# Patient Record
Sex: Female | Born: 1960 | Hispanic: Yes | Marital: Married | Smoking: Never smoker
Health system: Southern US, Community
[De-identification: ages and names within clinical notes are randomized; demographics above are authoritative.]

## PROBLEM LIST (undated history)

## (undated) DIAGNOSIS — IMO0002 Reserved for concepts with insufficient information to code with codable children: Secondary | ICD-10-CM

## (undated) DIAGNOSIS — K219 Gastro-esophageal reflux disease without esophagitis: Secondary | ICD-10-CM

## (undated) DIAGNOSIS — J45909 Unspecified asthma, uncomplicated: Secondary | ICD-10-CM

## (undated) DIAGNOSIS — M329 Systemic lupus erythematosus, unspecified: Secondary | ICD-10-CM

## (undated) HISTORY — PX: BACK SURGERY: SHX140

---

## 2004-05-27 ENCOUNTER — Ambulatory Visit: Payer: Self-pay | Admitting: Family Medicine

## 2006-01-22 ENCOUNTER — Emergency Department: Payer: Self-pay | Admitting: Emergency Medicine

## 2006-07-21 ENCOUNTER — Ambulatory Visit: Payer: Self-pay | Admitting: Family Medicine

## 2007-02-07 ENCOUNTER — Ambulatory Visit: Payer: Self-pay

## 2007-04-11 ENCOUNTER — Ambulatory Visit: Payer: Self-pay | Admitting: Family Medicine

## 2007-08-28 DIAGNOSIS — K219 Gastro-esophageal reflux disease without esophagitis: Secondary | ICD-10-CM | POA: Insufficient documentation

## 2007-08-28 DIAGNOSIS — M329 Systemic lupus erythematosus, unspecified: Secondary | ICD-10-CM | POA: Insufficient documentation

## 2008-04-03 ENCOUNTER — Ambulatory Visit: Payer: Self-pay | Admitting: Family Medicine

## 2010-04-09 ENCOUNTER — Ambulatory Visit: Payer: Self-pay | Admitting: Family Medicine

## 2011-11-24 ENCOUNTER — Ambulatory Visit: Payer: Self-pay | Admitting: Family Medicine

## 2012-03-08 ENCOUNTER — Ambulatory Visit: Payer: Self-pay | Admitting: Family Medicine

## 2013-02-16 ENCOUNTER — Ambulatory Visit: Payer: Self-pay | Admitting: Family Medicine

## 2013-11-08 ENCOUNTER — Ambulatory Visit: Payer: Self-pay | Admitting: Family Medicine

## 2013-11-14 ENCOUNTER — Ambulatory Visit: Payer: Self-pay | Admitting: Family Medicine

## 2014-08-21 ENCOUNTER — Ambulatory Visit: Payer: Self-pay | Admitting: Family Medicine

## 2014-11-15 ENCOUNTER — Ambulatory Visit
Admit: 2014-11-15 | Disposition: A | Discharge status: Home / Self Care | Payer: Self-pay | Attending: Gastroenterology | Admitting: Gastroenterology

## 2014-12-30 ENCOUNTER — Other Ambulatory Visit: Payer: Self-pay | Admitting: Family Medicine

## 2014-12-30 DIAGNOSIS — Z0000 Encounter for general adult medical examination without abnormal findings: Secondary | ICD-10-CM

## 2015-01-03 ENCOUNTER — Ambulatory Visit: Payer: Self-pay

## 2015-01-08 ENCOUNTER — Ambulatory Visit: Payer: Self-pay

## 2015-01-21 ENCOUNTER — Ambulatory Visit
Admission: RE | Admit: 2015-01-21 | Discharge: 2015-01-21 | Disposition: A | Discharge status: Home / Self Care | Payer: Medicare Other | Source: Ambulatory Visit | Attending: Family Medicine | Admitting: Family Medicine

## 2015-01-21 DIAGNOSIS — M8580 Other specified disorders of bone density and structure, unspecified site: Secondary | ICD-10-CM | POA: Diagnosis not present

## 2015-01-21 DIAGNOSIS — Z0000 Encounter for general adult medical examination without abnormal findings: Secondary | ICD-10-CM

## 2015-01-21 DIAGNOSIS — Z13820 Encounter for screening for osteoporosis: Secondary | ICD-10-CM | POA: Diagnosis present

## 2015-01-31 DIAGNOSIS — M8580 Other specified disorders of bone density and structure, unspecified site: Secondary | ICD-10-CM | POA: Insufficient documentation

## 2015-04-23 ENCOUNTER — Other Ambulatory Visit: Payer: Self-pay | Admitting: Orthopedic Surgery

## 2015-04-23 DIAGNOSIS — M5136 Other intervertebral disc degeneration, lumbar region: Secondary | ICD-10-CM

## 2015-04-23 DIAGNOSIS — M5416 Radiculopathy, lumbar region: Secondary | ICD-10-CM

## 2015-04-30 ENCOUNTER — Ambulatory Visit: Payer: Medicare Other

## 2015-05-07 ENCOUNTER — Ambulatory Visit: Payer: Medicare Other

## 2015-05-21 ENCOUNTER — Ambulatory Visit: Payer: Medicare Other

## 2015-05-29 ENCOUNTER — Ambulatory Visit: Payer: Medicare Other

## 2015-06-10 ENCOUNTER — Ambulatory Visit
Admission: RE | Admit: 2015-06-10 | Discharge: 2015-06-10 | Disposition: A | Discharge status: Home / Self Care | Payer: Medicare Other | Source: Ambulatory Visit | Attending: Orthopedic Surgery | Admitting: Orthopedic Surgery

## 2015-06-10 DIAGNOSIS — M5136 Other intervertebral disc degeneration, lumbar region: Secondary | ICD-10-CM

## 2015-06-10 DIAGNOSIS — M419 Scoliosis, unspecified: Secondary | ICD-10-CM | POA: Insufficient documentation

## 2015-06-10 DIAGNOSIS — M5416 Radiculopathy, lumbar region: Secondary | ICD-10-CM | POA: Insufficient documentation

## 2017-02-07 ENCOUNTER — Other Ambulatory Visit: Payer: Self-pay | Admitting: Family Medicine

## 2017-02-07 DIAGNOSIS — M8580 Other specified disorders of bone density and structure, unspecified site: Secondary | ICD-10-CM

## 2017-02-07 DIAGNOSIS — Z0000 Encounter for general adult medical examination without abnormal findings: Secondary | ICD-10-CM

## 2017-02-25 ENCOUNTER — Other Ambulatory Visit: Payer: Medicare Other

## 2017-04-06 ENCOUNTER — Ambulatory Visit
Admission: RE | Admit: 2017-04-06 | Discharge: 2017-04-06 | Disposition: A | Discharge status: Home / Self Care | Payer: Medicare Other | Source: Ambulatory Visit | Attending: Family Medicine | Admitting: Family Medicine

## 2017-04-06 DIAGNOSIS — Z1231 Encounter for screening mammogram for malignant neoplasm of breast: Secondary | ICD-10-CM | POA: Insufficient documentation

## 2017-04-06 DIAGNOSIS — Z0000 Encounter for general adult medical examination without abnormal findings: Secondary | ICD-10-CM

## 2017-04-06 DIAGNOSIS — M85851 Other specified disorders of bone density and structure, right thigh: Secondary | ICD-10-CM | POA: Diagnosis not present

## 2017-04-06 DIAGNOSIS — M8580 Other specified disorders of bone density and structure, unspecified site: Secondary | ICD-10-CM

## 2017-07-18 ENCOUNTER — Other Ambulatory Visit: Payer: Self-pay | Admitting: Family Medicine

## 2017-07-18 ENCOUNTER — Ambulatory Visit
Admission: RE | Admit: 2017-07-18 | Discharge: 2017-07-18 | Disposition: A | Discharge status: Home / Self Care | Payer: Medicare Other | Source: Ambulatory Visit | Attending: Family Medicine | Admitting: Family Medicine

## 2017-07-18 DIAGNOSIS — M545 Low back pain: Principal | ICD-10-CM

## 2017-07-18 DIAGNOSIS — M5136 Other intervertebral disc degeneration, lumbar region: Secondary | ICD-10-CM | POA: Insufficient documentation

## 2017-07-18 DIAGNOSIS — G8929 Other chronic pain: Secondary | ICD-10-CM

## 2017-08-18 ENCOUNTER — Other Ambulatory Visit: Payer: Self-pay | Admitting: Neurology

## 2017-08-18 DIAGNOSIS — R531 Weakness: Secondary | ICD-10-CM

## 2017-08-23 ENCOUNTER — Other Ambulatory Visit: Payer: Self-pay | Admitting: Specialist

## 2017-08-23 DIAGNOSIS — R911 Solitary pulmonary nodule: Secondary | ICD-10-CM

## 2017-08-25 ENCOUNTER — Ambulatory Visit: Payer: Medicare Other

## 2017-09-14 ENCOUNTER — Ambulatory Visit
Admission: RE | Admit: 2017-09-14 | Discharge: 2017-09-14 | Disposition: A | Discharge status: Home / Self Care | Payer: Medicare Other | Source: Ambulatory Visit | Attending: Specialist | Admitting: Specialist

## 2017-09-14 DIAGNOSIS — I700 Atherosclerosis of aorta: Secondary | ICD-10-CM | POA: Insufficient documentation

## 2017-09-14 DIAGNOSIS — R918 Other nonspecific abnormal finding of lung field: Secondary | ICD-10-CM | POA: Insufficient documentation

## 2017-09-14 DIAGNOSIS — R911 Solitary pulmonary nodule: Secondary | ICD-10-CM | POA: Diagnosis present

## 2017-09-23 ENCOUNTER — Other Ambulatory Visit: Payer: Self-pay | Admitting: Specialist

## 2017-09-23 DIAGNOSIS — J984 Other disorders of lung: Secondary | ICD-10-CM

## 2018-02-28 ENCOUNTER — Ambulatory Visit: Payer: Medicare Other | Attending: Specialist

## 2018-04-20 ENCOUNTER — Ambulatory Visit
Admission: RE | Admit: 2018-04-20 | Discharge: 2018-04-20 | Disposition: A | Discharge status: Home / Self Care | Payer: Medicare Other | Source: Ambulatory Visit | Attending: Specialist | Admitting: Specialist

## 2018-04-20 DIAGNOSIS — R911 Solitary pulmonary nodule: Secondary | ICD-10-CM | POA: Diagnosis not present

## 2018-04-20 DIAGNOSIS — I700 Atherosclerosis of aorta: Secondary | ICD-10-CM | POA: Diagnosis not present

## 2018-04-20 DIAGNOSIS — J984 Other disorders of lung: Secondary | ICD-10-CM | POA: Diagnosis not present

## 2018-04-28 ENCOUNTER — Other Ambulatory Visit: Payer: Self-pay | Admitting: Specialist

## 2018-04-28 DIAGNOSIS — R918 Other nonspecific abnormal finding of lung field: Secondary | ICD-10-CM

## 2018-05-03 ENCOUNTER — Ambulatory Visit: Payer: Medicare Other

## 2018-07-03 ENCOUNTER — Other Ambulatory Visit: Payer: Self-pay | Admitting: Orthopedic Surgery

## 2018-07-03 DIAGNOSIS — M5416 Radiculopathy, lumbar region: Secondary | ICD-10-CM

## 2018-07-13 ENCOUNTER — Other Ambulatory Visit: Payer: Medicare Other

## 2018-07-22 ENCOUNTER — Ambulatory Visit
Admission: RE | Admit: 2018-07-22 | Discharge: 2018-07-22 | Disposition: A | Discharge status: Home / Self Care | Payer: Medicare Other | Source: Ambulatory Visit | Attending: Orthopedic Surgery | Admitting: Orthopedic Surgery

## 2018-07-22 DIAGNOSIS — M5416 Radiculopathy, lumbar region: Secondary | ICD-10-CM

## 2019-01-01 ENCOUNTER — Emergency Department: Payer: Medicare Other

## 2019-01-01 ENCOUNTER — Encounter: Payer: Self-pay | Admitting: Medical Oncology

## 2019-01-01 ENCOUNTER — Emergency Department
Admission: EM | Admit: 2019-01-01 | Discharge: 2019-01-01 | Disposition: A | Discharge status: Home / Self Care | Payer: Medicare Other | Attending: Emergency Medicine | Admitting: Emergency Medicine

## 2019-01-01 DIAGNOSIS — U071 COVID-19: Secondary | ICD-10-CM | POA: Diagnosis not present

## 2019-01-01 DIAGNOSIS — J988 Other specified respiratory disorders: Secondary | ICD-10-CM | POA: Diagnosis not present

## 2019-01-01 DIAGNOSIS — R0789 Other chest pain: Secondary | ICD-10-CM

## 2019-01-01 LAB — COMPREHENSIVE METABOLIC PANEL
ALT: 25 U/L (ref 0–44)
AST: 30 U/L (ref 15–41)
Albumin: 4.1 g/dL (ref 3.5–5.0)
Alkaline Phosphatase: 43 U/L (ref 38–126)
Anion gap: 11 (ref 5–15)
BUN: 8 mg/dL (ref 6–20)
CO2: 23 mmol/L (ref 22–32)
Calcium: 8.6 mg/dL — ABNORMAL LOW (ref 8.9–10.3)
Chloride: 102 mmol/L (ref 98–111)
Creatinine, Ser: 0.51 mg/dL (ref 0.44–1.00)
GFR calc Af Amer: >60 mL/min (ref >60)
GFR calc non Af Amer: >60 mL/min (ref >60)
Glucose, Bld: 107 mg/dL — ABNORMAL HIGH (ref 70–99)
Potassium: 3.9 mmol/L (ref 3.5–5.1)
Sodium: 136 mmol/L (ref 135–145)
Total Bilirubin: 0.4 mg/dL (ref 0.3–1.2)
Total Protein: 6.9 g/dL (ref 6.5–8.1)

## 2019-01-01 LAB — CBC WITH DIFFERENTIAL/PLATELET
Abs Immature Granulocytes: 0.01 10*3/uL (ref 0.00–0.07)
Basophils Absolute: 0 10*3/uL (ref 0.0–0.1)
Basophils Relative: 0 %
Eosinophils Absolute: 0 10*3/uL (ref 0.0–0.5)
Eosinophils Relative: 0 %
HCT: 35.4 % — ABNORMAL LOW (ref 36.0–46.0)
Hemoglobin: 11.8 g/dL — ABNORMAL LOW (ref 12.0–15.0)
Immature Granulocytes: 0 %
Lymphocytes Relative: 23 %
Lymphs Abs: 1.1 10*3/uL (ref 0.7–4.0)
MCH: 30 pg (ref 26.0–34.0)
MCHC: 33.3 g/dL (ref 30.0–36.0)
MCV: 90.1 fL (ref 80.0–100.0)
Monocytes Absolute: 0.6 10*3/uL (ref 0.1–1.0)
Monocytes Relative: 12 %
Neutro Abs: 3.2 10*3/uL (ref 1.7–7.7)
Neutrophils Relative %: 65 %
Platelets: 204 10*3/uL (ref 150–400)
RBC: 3.93 MIL/uL (ref 3.87–5.11)
RDW: 12.5 % (ref 11.5–15.5)
WBC: 4.9 10*3/uL (ref 4.0–10.5)
nRBC: 0 % (ref 0.0–0.2)

## 2019-01-01 LAB — TROPONIN I: Troponin I: <0.03 ng/mL (ref <0.03)

## 2019-01-01 MED ORDER — HYDROXYZINE HCL 25 MG PO TABS: 25.0000 mg | ORAL_TABLET | Freq: Three times a day (TID) | ORAL | 0 refills | Status: AC | PRN

## 2019-01-01 MED ORDER — GUAIFENESIN-CODEINE 100-10 MG/5ML PO SOLN: 5.0000 mL | Freq: Four times a day (QID) | ORAL | 0 refills | Status: AC | PRN

## 2019-01-01 NOTE — Discharge Instructions (Addendum)
Person Under Monitoring Name: Jennifer Trujillo  Location: [place] [address]   Infection Prevention Recommendations for Individuals Confirmed to have, or Being Evaluated for, 2019 Novel Coronavirus (COVID-19) Infection Who Receive Care at Home  Individuals who are confirmed to have, or are being evaluated for, COVID-19 should follow the prevention steps below until a healthcare provider or local or state health department says they can return to normal activities.  Stay home except to get medical care You should restrict activities outside your home, except for getting medical care. Do not go to work, school, or public areas, and do not use public transportation or taxis.  Call ahead before visiting your doctor Before your medical appointment, call the healthcare provider and tell them that you have, or are being evaluated for, COVID-19 infection. This will help the healthcare providers office take steps to keep other people from getting infected. Ask your healthcare provider to call the local or state health department.  Monitor your symptoms Seek prompt medical attention if your illness is worsening (e.g., difficulty breathing). Before going to your medical appointment, call the healthcare provider and tell them that you have, or are being evaluated for, COVID-19 infection. Ask your healthcare provider to call the local or state health department.  Wear a facemask You should wear a facemask that covers your nose and mouth when you are in the same room with other people and when you visit a healthcare provider. People who live with or visit you should also wear a facemask while they are in the same room with you.  Separate yourself from other people in your home As much as possible, you should stay in a different room from other people in your home. Also, you should use a separate bathroom, if available.  Avoid sharing household items You should not share  dishes, drinking glasses, cups, eating utensils, towels, bedding, or other items with other people in your home. After using these items, you should wash them thoroughly with soap and water.  Cover your coughs and sneezes Cover your mouth and nose with a tissue when you cough or sneeze, or you can cough or sneeze into your sleeve. Throw used tissues in a lined trash can, and immediately wash your hands with soap and water for at least 20 seconds or use an alcohol-based hand rub.  Wash your [facility] your hands often and thoroughly with soap and water for at least 20 seconds. You can use an alcohol-based hand sanitizer if soap and water are not available and if your hands are not visibly dirty. Avoid touching your eyes, nose, and mouth with unwashed hands.   Prevention Steps for Caregivers and Household Members of Individuals Confirmed to have, or Being Evaluated for, COVID-19 Infection Being Cared for in the Home  If you live with, or provide care at home for, a person confirmed to have, or being evaluated for, COVID-19 infection please follow these guidelines to prevent infection:  Follow healthcare providers instructions Make sure that you understand and can help the patient follow any healthcare provider instructions for all care.  Provide for the patients basic needs You should help the patient with basic needs in the home and provide support for getting groceries, prescriptions, and other personal needs.  Monitor the patients symptoms If they are getting sicker, call his or her medical provider and tell them that the patient has, or is being evaluated for, COVID-19 infection. This will help the healthcare providers office take  steps to keep other people from getting infected. Ask the healthcare provider to call the local or state health department.  Limit the number of people who have contact with the patient If possible, have only one caregiver for the patient. Other  household members should stay in another home or place of residence. If this is not possible, they should stay in another room, or be separated from the patient as much as possible. Use a separate bathroom, if available. Restrict visitors who do not have an essential need to be in the home.  Keep older adults, very young children, and other sick people away from the patient Keep older adults, very young children, and those who have compromised immune systems or chronic health conditions away from the patient. This includes people with chronic heart, lung, or kidney conditions, diabetes, and cancer.  Ensure good ventilation Make sure that shared spaces in the home have good air flow, such as from an air conditioner or an opened window, weather permitting.  Wash your hands often Wash your hands often and thoroughly with soap and water for at least 20 seconds. You can use an alcohol based hand sanitizer if soap and water are not available and if your hands are not visibly dirty. Avoid touching your eyes, nose, and mouth with unwashed hands. Use disposable paper towels to dry your hands. If not available, use dedicated cloth towels and replace them when they become wet.  Wear a facemask and gloves Wear a disposable facemask at all times in the room and gloves when you touch or have contact with the patients blood, body fluids, and/or secretions or excretions, such as sweat, saliva, sputum, nasal mucus, vomit, urine, or feces.  Ensure the mask fits over your nose and mouth tightly, and do not touch it during use. Throw out disposable facemasks and gloves after using them. Do not reuse. Wash your hands immediately after removing your facemask and gloves. If your personal clothing becomes contaminated, carefully remove clothing and launder. Wash your hands after handling contaminated clothing. Place all used disposable facemasks, gloves, and other waste in a lined container before disposing them with  other household waste. Remove gloves and wash your hands immediately after handling these items.  Do not share dishes, glasses, or other household items with the patient Avoid sharing household items. You should not share dishes, drinking glasses, cups, eating utensils, towels, bedding, or other items with a patient who is confirmed to have, or being evaluated for, COVID-19 infection. After the person uses these items, you should wash them thoroughly with soap and water.  Wash laundry thoroughly Immediately remove and wash clothes or bedding that have blood, body fluids, and/or secretions or excretions, such as sweat, saliva, sputum, nasal mucus, vomit, urine, or feces, on them. Wear gloves when handling laundry from the patient. Read and follow directions on labels of laundry or clothing items and detergent. In general, wash and dry with the warmest temperatures recommended on the label.  Clean all areas the individual has used often Clean all touchable surfaces, such as counters, tabletops, doorknobs, bathroom fixtures, toilets, phones, keyboards, tablets, and bedside tables, every day. Also, clean any surfaces that may have blood, body fluids, and/or secretions or excretions on them. Wear gloves when cleaning surfaces the patient has come in contact with. Use a diluted bleach solution (e.g., dilute bleach with 1 part bleach and 10 parts water) or a household disinfectant with a label that says EPA-registered for coronaviruses. To make a bleach solution  at home, add 1 tablespoon of bleach to 1 quart (4 cups) of water. For a larger supply, add  cup of bleach to 1 gallon (16 cups) of water. Read labels of cleaning products and follow recommendations provided on product labels. Labels contain instructions for safe and effective use of the cleaning product including precautions you should take when applying the product, such as wearing gloves or eye protection and making sure you have good ventilation  during use of the product. Remove gloves and wash hands immediately after cleaning.  Monitor yourself for signs and symptoms of illness Caregivers and household members are considered close contacts, should monitor their health, and will be asked to limit movement outside of the home to the extent possible. Follow the monitoring steps for close contacts listed on the symptom monitoring form.   ? If you have additional questions, contact your local health department or call the epidemiologist on call at [phone] (available 24/7). ? This guidance is subject to change. For the most up-to-date guidance from [facility], please refer to their website: [email or web address]

## 2019-01-01 NOTE — ED Provider Notes (Signed)
[facility] Emergency Department Provider Note  Time seen: 4:31 PM  I have reviewed the triage vital signs and the nursing notes.   HISTORY  Chief Complaint Chest Pain and Shortness of Breath   HPI Jennifer Trujillo is a 58 y.o. female with a past medical history of anxiety, hyperlipidemia, discoid lupus, chronic back pain, asthma, presents to the emergency department for chest pressure and shortness of breath.  According to the patient over the past 1 week or so she has been experiencing symptoms of intermittent shortness of breath and cough.  [facility] COVID testing which was positive.  Patient states today she felt somewhat more short of breath and was experiencing chest pressure earlier today.  Patient arrives via EMS from home.  Currently denies any chest pain or pressure.  Does state mild shortness of breath but states largely unchanged since her diagnosis.  Has an occasional cough during exam.  Largely negative review of systems otherwise.  No abdominal pain, no current chest pain, no vomiting or diarrhea.  No dysuria.  No past medical history on file.  There are no active problems to display for this patient.   No past surgical history on file.  Prior to Admission medications   Not on File    Not on File  Family History  Problem Relation Age of Onset  . Breast cancer Neg Hx     Social History Social History   Tobacco Use  . Smoking status: Not on file  Substance Use Topics  . Alcohol use: Not on file  . Drug use: Not on file    Review of Systems Constitutional: Intermittent fever ENT: Negative for recent illness/congestion Cardiovascular: Chest pressure this morning Respiratory: Mild shortness of breath.  Positive for cough. Gastrointestinal: Negative for abdominal pain, vomiting and diarrhea. Genitourinary: Negative for urinary compaints Musculoskeletal: Negative for musculoskeletal complaints Skin: Negative  for skin complaints  Neurological: Negative for headache All other ROS negative  ____________________________________________   PHYSICAL EXAM:  VITAL SIGNS: ED Triage Vitals  Enc Vitals Group     BP --      Pulse Rate 01/01/19 1629 84     Resp 01/01/19 1629 18     Temp --      Temp src --      SpO2 01/01/19 1629 100 %     Weight 01/01/19 1630 106 lb (48.1 kg)     Height 01/01/19 1630 5' (1.524 m)     Head Circumference --      Peak Flow --      Pain Score 01/01/19 1630 5     Pain Loc --      Pain Edu? --      Excl. in GC? --    Constitutional: Alert and oriented. Well appearing and in no distress. Eyes: Normal exam ENT      Head: Normocephalic and atraumatic.      Mouth/Throat: Mucous membranes are moist. Cardiovascular: Normal rate, regular rhythm Respiratory: Normal respiratory effort without tachypnea nor retractions. Breath sounds are clear, without wheeze rales rhonchi.  Occasional cough during exam. Gastrointestinal: Soft and nontender. No distention.   Musculoskeletal: Nontender with normal range of motion in all extremities.  Neurologic:  Normal speech and language. No gross focal neurologic deficits  Skin:  Skin is warm, dry and intact.  Psychiatric: Mood and affect are normal.   ____________________________________________    EKG  EKG viewed and interpreted by myself shows a normal sinus rhythm  at 78 bpm with a narrow QRS, normal axis, normal intervals, no ST changes.  ____________________________________________    RADIOLOGY  Chest x-ray negative  ____________________________________________   INITIAL IMPRESSION / ASSESSMENT AND PLAN / ED COURSE  Pertinent labs & imaging results that were available during my care of the patient were reviewed by me and considered in my medical decision making (see chart for details).   Patient with a known positive COVID test presents to the emergency department for chest pressure this morning as well as  continued shortness of breath and cough.  Overall the patient appears extremely well my examination, clear lung sounds does have an occasional dry cough.  Denies any chest pain or pressure at this time.  Patient is EKG is reassuring.  Differential this time would include ACS, COVID-19, pneumonia.  We will check labs including cardiac enzymes and a chest x-ray.  Patient is EKG is reassuring.  Satting 100% on room air currently with a normal respiratory and heart rate.   Chest x-ray is negative.  EKG is reassuring.  Labs are within normal limits including a negative troponin.  Overall the patient's work-up is extremely reassuring with a normal physical exam and reassuring vitals.  Patient is very reassured by the work-up.  We will discharge with a codeine cough medication patient will continue to use Tylenol or ibuprofen every 6 hours for fever or discomfort.  Patient agreeable to plan of care.   Jennifer Trujillo was evaluated in Emergency Department on 01/01/2019 for the symptoms described in the history of present illness. She was evaluated in the context of the global COVID-19 pandemic, which necessitated consideration that the patient might be at risk for infection with the SARS-CoV-2 virus that causes COVID-19. Institutional protocols and algorithms that pertain to the evaluation of patients at risk for COVID-19 are in a state of rapid change based on information released by regulatory bodies including the CDC and federal and state organizations. These policies and algorithms were followed during the patient's care in the ED.  ____________________________________________   FINAL CLINICAL IMPRESSION(S) / ED DIAGNOSES  Chest pressure COVID-19   Harvest Dark, MD 01/01/19 1846

## 2019-01-01 NOTE — ED Triage Notes (Signed)
Pt to ED via ems from home c/o sob that began a few days ago, pt tested positive for Covid on Saturday at [facility]. States that she began having substernal chest pain yesterday that got better then returned this am.

## 2019-01-08 DIAGNOSIS — U071 COVID-19: Secondary | ICD-10-CM | POA: Insufficient documentation

## 2019-05-03 ENCOUNTER — Other Ambulatory Visit: Payer: Self-pay

## 2019-05-03 ENCOUNTER — Ambulatory Visit
Admission: RE | Admit: 2019-05-03 | Discharge: 2019-05-03 | Disposition: A | Discharge status: Home / Self Care | Payer: Medicare Other | Source: Ambulatory Visit | Attending: Specialist | Admitting: Specialist

## 2019-05-03 DIAGNOSIS — R918 Other nonspecific abnormal finding of lung field: Secondary | ICD-10-CM | POA: Diagnosis present

## 2019-05-08 ENCOUNTER — Encounter: Payer: Self-pay | Admitting: Family Medicine

## 2019-05-09 ENCOUNTER — Other Ambulatory Visit: Payer: Self-pay

## 2019-05-10 ENCOUNTER — Other Ambulatory Visit: Payer: Self-pay

## 2019-05-10 ENCOUNTER — Ambulatory Visit (INDEPENDENT_AMBULATORY_CARE_PROVIDER_SITE_OTHER): Payer: Medicare Other | Admitting: Gastroenterology

## 2019-05-10 ENCOUNTER — Encounter: Payer: Self-pay | Admitting: Gastroenterology

## 2019-05-10 VITALS — BP 127/79 | HR 80 | Temp 98.1°F | Resp 17 | Wt 107.4 lb

## 2019-05-10 DIAGNOSIS — K219 Gastro-esophageal reflux disease without esophagitis: Secondary | ICD-10-CM | POA: Diagnosis not present

## 2019-05-10 NOTE — Progress Notes (Signed)
Cephas Darby, MD [address]  [place]  [place], [place] [address]  Main: [phone]  Fax: [phone]    Gastroenterology Consultation  Referring Provider:     Denton Lank, MD Primary Care Physician:  Denton Lank, MD Primary Gastroenterologist:  Dr. Cephas Darby Reason for Consultation:   Chronic GERD        HPI:   Jennifer Trujillo is a 58 y.o. female referred by Dr. Denton Lank, MD  for consultation & management of chronic GERD.  Patient reports that she has been experiencing " a gas bubble" stuck in her chest as well as mild periumbilical discomfort. She denies regurgitation, difficulty swallowing.  She has been started on omeprazole 20 mg by her PCP, which she takes daily and a second pill in the afternoon as needed.  She reports her symptoms are partially improved.  She drinks decaffeinated coffee.  Her weight is stable.  She has history of systemic lupus, maintained on Plaquenil only.  She denies any other GI symptoms.  She had limited abdominal ultrasound which was unremarkable She does not smoke or drink alcohol NSAIDs: None  Antiplts/Anticoagulants/Anti thrombotics: None  GI Procedures: Colonoscopy at [facility] 11/15/2014 Report not available She denies family history of GI malignancy  Current Outpatient Medications:  .  acetaminophen (TYLENOL) 500 MG tablet, Take by mouth., Disp: , Rfl:  .  albuterol (VENTOLIN HFA) 108 (90 Base) MCG/ACT inhaler, Inhale into the lungs., Disp: , Rfl:  .  aspirin 81 MG chewable tablet, Chew by mouth., Disp: , Rfl:  .  calcium carbonate (OS-CAL) 600 MG TABS tablet, Take by mouth., Disp: , Rfl:  .  cetirizine (ZYRTEC) 10 MG tablet, Take by mouth., Disp: , Rfl:  .  Cholecalciferol (D2000 ULTRA STRENGTH) 50 MCG (2000 UT) CAPS, Take by mouth., Disp: , Rfl:  .  eletriptan (RELPAX) 40 MG tablet, Take by mouth., Disp: , Rfl:  .  Ergocalciferol (VITAMIN D2) 50 MCG (2000 UT) TABS, Take by mouth., Disp: , Rfl:  .  fluticasone (FLONASE)  50 MCG/ACT nasal spray, Place into the nose., Disp: , Rfl:  .  gabapentin (NEURONTIN) 100 MG capsule, Take by mouth., Disp: , Rfl:  .  hydroxychloroquine (PLAQUENIL) 200 MG tablet, Take by mouth., Disp: , Rfl:  .  omega-3 acid ethyl esters (LOVAZA) 1 g capsule, Take by mouth., Disp: , Rfl:  .  riboflavin (VITAMIN B-2) 100 MG TABS tablet, Take by mouth., Disp: , Rfl:  .  traMADol (ULTRAM) 50 MG tablet, Take by mouth., Disp: , Rfl:  .  etodolac (LODINE) 500 MG tablet, Take by mouth., Disp: , Rfl:  .  guaiFENesin-codeine 100-10 MG/5ML syrup, Take 5 mLs by mouth every 6 (six) hours as needed for cough. (Patient not taking: Reported on 05/10/2019), Disp: 120 mL, Rfl: 0 .  hydrOXYzine (ATARAX/VISTARIL) 25 MG tablet, Take 1 tablet (25 mg total) by mouth 3 (three) times daily as needed for anxiety. (Patient not taking: Reported on 05/10/2019), Disp: 20 tablet, Rfl: 0 .  montelukast (SINGULAIR) 10 MG tablet, Take by mouth., Disp: , Rfl:  .  predniSONE (DELTASONE) 5 MG tablet, Take by mouth., Disp: , Rfl:    Family History  Problem Relation Age of Onset  . Breast cancer Neg Hx      Social History   Tobacco Use  . Smoking status: Never Smoker  . Smokeless tobacco: Never Used  Substance Use Topics  . Alcohol use: Never    Frequency: Never  . Drug  use: Never    Allergies as of 05/10/2019 - Review Complete 05/10/2019  Allergen Reaction Noted  . Daucus carota Other (See Comments) and Shortness Of Breath 11/22/2012  . Dextromethorphan-guaifenesin Other (See Comments) 06/15/2017    Review of Systems:    All systems reviewed and negative except where noted in HPI.   Physical Exam:  BP 127/79 (BP Location: Left Arm, Patient Position: Sitting, Cuff Size: Normal)   Pulse 80   Temp 98.1 F (36.7 C)   Resp 17   Wt 107 lb 6.4 oz (48.7 kg)   BMI 20.98 kg/m  No LMP recorded. Patient is postmenopausal.  General:   Alert, thin built, pleasant and cooperative in NAD Head:  Normocephalic and  atraumatic. Eyes:  Sclera clear, no icterus.   Conjunctiva pink. Ears:  Normal auditory acuity. Nose:  No deformity, discharge, or lesions. Mouth:  No deformity or lesions,oropharynx pink & moist. Neck:  Supple; no masses or thyromegaly. Lungs:  Respirations even and unlabored.  Clear throughout to auscultation.   No wheezes, crackles, or rhonchi. No acute distress. Heart:  Regular rate and rhythm; no murmurs, clicks, rubs, or gallops. Abdomen:  Normal bowel sounds. Soft, non-tender and non-distended without masses, hepatosplenomegaly or hernias noted.  No guarding or rebound tenderness.   Rectal: Not performed Msk:  Symmetrical without gross deformities. Good, equal movement & strength bilaterally. Pulses:  Normal pulses noted. Extremities:  No clubbing or edema.  No cyanosis. Neurologic:  Alert and oriented x3;  grossly normal neurologically. Skin:  Intact without significant lesions or rashes. No jaundice. Psych:  Alert and cooperative. Normal mood and affect.  Imaging Studies: Reviewed  Assessment and Plan:   Jennifer Trujillo is a 58 y.o. female with history of lupus, maintained on Plaquenil is referred for chronic GERD  Continue omeprazole 20 mg 1-2 times daily Recommend EGD with gastric and esophageal biopsies due to chronicity of symptoms and her age Antireflux lifestyle  Follow up in 3 months   Arlyss Repress, MD

## 2019-05-10 NOTE — Patient Instructions (Signed)
Enfermedad de reflujo gastroesofgico en los adultos Gastroesophageal Reflux Disease, Adult El reflujo gastroesofgico (RGE) ocurre cuando el cido del estmago sube por el tubo que conecta la boca con el estmago (esfago). Normalmente, la comida baja por el esfago y se mantiene en el estmago, donde se la digiere. Cuando una persona tiene RGE, los alimentos y el cido estomacal suelen volver al esfago. Usted puede tener una enfermedad llamada enfermedad de reflujo gastroesofgico (ERGE) si el reflujo:  Sucede a menudo.  Causa sntomas frecuentes o muy intensos.  Causa problemas tales como dao en el esfago. Cuando esto ocurre, el esfago duele y se hincha (inflama). Con el tiempo, la ERGE puede ocasionar pequeos agujeros (lceras) en el revestimiento del esfago. Cules son las causas? Esta afeccin se debe a un problema en el msculo que se encuentra entre el esfago y [place]. Cuando este msculo est dbil o no es normal, no se cierra correctamente para impedir que los alimentos y el cido regresen del Paramedic. El msculo puede debilitarse debido a lo siguiente:  El consumo de [place].  [place].  Tener cierto tipo de hernia (hernia de hiato).  Consumo de alcohol.  Ciertos alimentos y bebidas, como caf, chocolate, cebollas y [place]. Qu incrementa el riesgo? Es ms probable que tenga esta afeccin si:  Tiene sobrepeso.  Tiene una enfermedad que afecta el tejido conjuntivo.  [place] antiinflamatorios no esteroideos (AINE). Cules son los signos o los sntomas? Los sntomas de esta afeccin incluyen:  Acidez estomacal.  Dificultad o dolor al tragar.  Sensacin de Best boy un bulto en la garganta.  Sabor amargo en la boca.  Mal aliento.  Tener una gran cantidad de saliva.  Estmago inflamado o con Tree surgeon.  Eructos.  Dolor en el pecho. El dolor de pecho puede deberse a distintas afecciones. Asegrese de Teacher, adult education a su mdico si tiene Tourist information centre manager.  Falta  de aire o respiracin ruidosa (sibilancias).  Tos constante (crnica) o durante la noche.  Desgaste de la superficie de los dientes (esmalte dental).  Prdida de peso. Cmo se trata? El tratamiento depender de la gravedad de los sntomas. El mdico puede sugerirle lo siguiente:  Cambios en la dieta.  Medicamentos.  Clementeen Hoof. Siga estas indicaciones en su casa: Comida y bebida   Siga una dieta como se lo haya indicado el mdico. Es posible que deba evitar alimentos y bebidas, por ejemplo: ? Caf y t (con o sin cafena). ? Bebidas que contengan alcohol. ? Bebidas energticas y deportivas. ? Bebidas gaseosas y refrescos. ? Chocolate y cacao. ? Menta y [place]. ? Ajo y cebolla. ? Rbano picante. ? Alimentos cidos y condimentados. Estos incluyen todos los tipos de pimientos, [place] en polvo, curry en polvo, vinagre, salsas picantes y [employer]. ? Ctricos y sus jugos, por ejemplo, naranjas, limones y limas. ? Alimentos que [employer]. Estos incluyen salsa roja, [place], salsa picante y pizza con salsa de [place]. ? Alimentos fritos y Radio broadcast assistant. Estos incluyen donas, papas fritas, papitas fritas de bolsa y aderezos con alto contenido de [place]. ? Carnes con alto contenido de [place]. Estas incluye los perros calientes, chuletas o costillas, embutidos, jamn y tocino. ? Productos lcteos ricos en grasas, como leche [place], [place] y [place] crema.  Consuma pequeas cantidades de comida con ms frecuencia. Evite consumir porciones abundantes.  Evite beber grandes cantidades de lquidos con las comidas.  Evite comer 2 o 3horas antes de acostarse.  Evite recostarse inmediatamente despus de comer.  No haga ejercicios  enseguida despus de comer. Estilo de vida   No consuma ningn producto que contenga nicotina o tabaco. Estos incluyen cigarrillos, cigarrillos electrnicos y tabaco para Higher education careers adviser. Si necesita ayuda para dejar de fumar, consulte al [employer].  Intente  reducir [employer] de estrs. Si necesita ayuda para hacer esto, consulte al mdico.  Si tiene sobrepeso, baje una cantidad de peso saludable para usted. Consulte a su mdico para bajar de peso de [employer]. Indicaciones generales  Est atento a cualquier cambio en los sntomas.  Tome los medicamentos de venta libre y los recetados solamente como se lo haya indicado el mdico. No tome aspirina, ibuprofeno ni otros AINE a menos que el mdico lo autorice.  Use ropa holgada. No use nada apretado alrededor de la cintura.  Levante (eleve) la cabecera de la cama aproximadamente 6pulgadas (15cm).  Evite inclinarse si al hacerlo empeoran los sntomas.  Concurra a todas las visitas de seguimiento como se lo haya indicado el mdico. Esto es importante. Comunquese con un mdico si:  Aparecen nuevos sntomas.  Adelgaza y no sabe por qu.  Tiene problemas para tragar o le duele cuando traga.  Tiene sibilancias o tos persistente.  Los sntomas no mejoran con Dispensing optician.  Tiene la voz ronca. Solicite ayuda inmediatamente si:  [employer], el cuello, la [place], los dientes o la espalda.  Se siente transpirado, mareado o tiene una sensacin de desvanecimiento.  Siente falta de aire o Tourist information centre manager.  Vomita y el vmito tiene un aspecto similar a la sangre o a los posos de caf.  Pierde el conocimiento (se desmaya).  Las deposiciones (heces) son sanguinolentas o negras.  No puede tragar, beber o comer. Resumen  Si una persona tiene enfermedad de reflujo gastroesofgico (ERGE), los alimentos y el cido estomacal suben al esfago y causan sntomas o problemas tales como dao en el esfago.  El tratamiento depender de la gravedad de los sntomas.  Siga una Lexicographer se lo haya indicado el [place] todos los medicamentos solamente como se lo haya indicado el mdico. Esta informacin no tiene Marine scientist el consejo del mdico. Asegrese de  hacerle al mdico cualquier pregunta que tenga. Document Released: 08/07/2010 Document Revised: 02/16/2018 Document Reviewed: 02/16/2018 Elsevier Patient Education  2020 [employer].

## 2019-05-24 ENCOUNTER — Other Ambulatory Visit: Payer: Self-pay

## 2019-05-24 ENCOUNTER — Other Ambulatory Visit
Admission: RE | Admit: 2019-05-24 | Discharge: 2019-05-24 | Disposition: A | Discharge status: Home / Self Care | Payer: Medicare Other | Source: Ambulatory Visit | Attending: Gastroenterology | Admitting: Gastroenterology

## 2019-05-24 DIAGNOSIS — Z20828 Contact with and (suspected) exposure to other viral communicable diseases: Secondary | ICD-10-CM | POA: Insufficient documentation

## 2019-05-24 DIAGNOSIS — Z01812 Encounter for preprocedural laboratory examination: Secondary | ICD-10-CM | POA: Diagnosis present

## 2019-05-24 LAB — SARS CORONAVIRUS 2 (TAT 6-24 HRS): SARS Coronavirus 2: NEGATIVE

## 2019-05-25 ENCOUNTER — Encounter: Payer: Self-pay | Admitting: *Deleted

## 2019-05-28 ENCOUNTER — Encounter: Payer: Self-pay | Admitting: *Deleted

## 2019-05-28 ENCOUNTER — Encounter
Admission: RE | Disposition: A | Discharge status: Home / Self Care | Payer: Self-pay | Source: Home / Self Care | Attending: Gastroenterology

## 2019-05-28 ENCOUNTER — Ambulatory Visit: Payer: Medicare Other | Admitting: Registered Nurse

## 2019-05-28 ENCOUNTER — Other Ambulatory Visit: Payer: Self-pay

## 2019-05-28 ENCOUNTER — Ambulatory Visit
Admission: RE | Admit: 2019-05-28 | Discharge: 2019-05-28 | Disposition: A | Discharge status: Home / Self Care | Payer: Medicare Other | Attending: Gastroenterology | Admitting: Gastroenterology

## 2019-05-28 DIAGNOSIS — Z888 Allergy status to other drugs, medicaments and biological substances status: Secondary | ICD-10-CM | POA: Diagnosis not present

## 2019-05-28 DIAGNOSIS — R1310 Dysphagia, unspecified: Secondary | ICD-10-CM | POA: Insufficient documentation

## 2019-05-28 DIAGNOSIS — Z7982 Long term (current) use of aspirin: Secondary | ICD-10-CM | POA: Insufficient documentation

## 2019-05-28 DIAGNOSIS — Z79899 Other long term (current) drug therapy: Secondary | ICD-10-CM | POA: Insufficient documentation

## 2019-05-28 DIAGNOSIS — J45909 Unspecified asthma, uncomplicated: Secondary | ICD-10-CM | POA: Diagnosis not present

## 2019-05-28 DIAGNOSIS — M329 Systemic lupus erythematosus, unspecified: Secondary | ICD-10-CM | POA: Diagnosis not present

## 2019-05-28 DIAGNOSIS — K219 Gastro-esophageal reflux disease without esophagitis: Secondary | ICD-10-CM

## 2019-05-28 HISTORY — DX: Unspecified asthma, uncomplicated: J45.909

## 2019-05-28 HISTORY — DX: Reserved for concepts with insufficient information to code with codable children: IMO0002

## 2019-05-28 HISTORY — DX: Gastro-esophageal reflux disease without esophagitis: K21.9

## 2019-05-28 HISTORY — PX: ESOPHAGOGASTRODUODENOSCOPY (EGD) WITH PROPOFOL: SHX5813

## 2019-05-28 HISTORY — DX: Systemic lupus erythematosus, unspecified: M32.9

## 2019-05-28 SURGERY — ESOPHAGOGASTRODUODENOSCOPY (EGD) WITH PROPOFOL
Anesthesia: General

## 2019-05-28 MED ORDER — LIDOCAINE HCL (PF) 2 % IJ SOLN
INTRAMUSCULAR | Status: AC
  Filled 2019-05-28: qty 10

## 2019-05-28 MED ORDER — PROPOFOL 500 MG/50ML IV EMUL
INTRAVENOUS | Status: DC | PRN
  Administered 2019-05-28: 150 ug/kg/min via INTRAVENOUS

## 2019-05-28 MED ORDER — SODIUM CHLORIDE 0.9 % IV SOLN
INTRAVENOUS | Status: DC
  Administered 2019-05-28: 08:00:00 via INTRAVENOUS

## 2019-05-28 MED ORDER — PROPOFOL 10 MG/ML IV BOLUS
INTRAVENOUS | Status: DC | PRN
  Administered 2019-05-28: 30 mg via INTRAVENOUS
  Administered 2019-05-28: 40 mg via INTRAVENOUS

## 2019-05-28 MED ORDER — LIDOCAINE HCL (CARDIAC) PF 100 MG/5ML IV SOSY
PREFILLED_SYRINGE | INTRAVENOUS | Status: DC | PRN
  Administered 2019-05-28: 40 mg via INTRAVENOUS

## 2019-05-28 MED ORDER — SUCCINYLCHOLINE CHLORIDE 20 MG/ML IJ SOLN
INTRAMUSCULAR | Status: AC
  Filled 2019-05-28: qty 1

## 2019-05-28 MED ORDER — LACTATED RINGERS IV SOLN
INTRAVENOUS | Status: DC | PRN
  Administered 2019-05-28: 08:00:00 via INTRAVENOUS

## 2019-05-28 MED ORDER — ONDANSETRON HCL 4 MG/2ML IJ SOLN
INTRAMUSCULAR | Status: DC | PRN
  Administered 2019-05-28: 4 mg via INTRAVENOUS

## 2019-05-28 MED ORDER — ONDANSETRON HCL 4 MG/2ML IJ SOLN
INTRAMUSCULAR | Status: AC
  Filled 2019-05-28: qty 2

## 2019-05-28 MED ORDER — PROPOFOL 10 MG/ML IV BOLUS
INTRAVENOUS | Status: AC
  Filled 2019-05-28: qty 20

## 2019-05-28 MED ORDER — PHENYLEPHRINE HCL (PRESSORS) 10 MG/ML IV SOLN
INTRAVENOUS | Status: AC
  Filled 2019-05-28: qty 1

## 2019-05-28 MED ORDER — PROPOFOL 10 MG/ML IV BOLUS
INTRAVENOUS | Status: AC
  Filled 2019-05-28: qty 60

## 2019-05-28 NOTE — Anesthesia Preprocedure Evaluation (Signed)
Anesthesia Evaluation  Patient identified by MRN, date of birth, ID band Patient awake    Reviewed: Allergy & Precautions, NPO status , Patient's Chart, lab work & pertinent test results  History of Anesthesia Complications (+) history of anesthetic complications ("almost died with [place] in [place]" for BTL)  Airway Mallampati: II       Dental   Pulmonary asthma , neg sleep apnea, neg COPD,  COPD inhaler, Not current smoker,           Cardiovascular (-) hypertension(-) Past MI and (-) CHF (-) dysrhythmias (-) Valvular Problems/Murmurs     Neuro/Psych neg Seizures    GI/Hepatic Neg liver ROS, GERD  Medicated,  Endo/Other  neg diabetes  Renal/GU negative Renal ROS     Musculoskeletal   Abdominal   Peds  Hematology   Anesthesia Other Findings   Reproductive/Obstetrics                             Anesthesia Physical Anesthesia Plan  ASA: II  Anesthesia Plan: General   Post-op Pain Management:    Induction: Intravenous  PONV Risk Score and Plan: 3 and Propofol infusion, TIVA and Treatment may vary due to age or medical condition  Airway Management Planned: Nasal Cannula  Additional Equipment:   Intra-op Plan:   Post-operative Plan:   Informed Consent: I have reviewed the patients History and Physical, chart, labs and discussed the procedure including the risks, benefits and alternatives for the proposed anesthesia with the patient or authorized representative who has indicated his/her understanding and acceptance.       Plan Discussed with:   Anesthesia Plan Comments:         Anesthesia Quick Evaluation

## 2019-05-28 NOTE — H&P (Signed)
Arlyss Repress, MD [address]  [address]  [place], [place] [address]  Main: [phone]  Fax: [phone] Pager: [phone]  Primary Care Physician:  Hillery Aldo, MD Primary Gastroenterologist:  Dr. Arlyss Repress  Pre-Procedure History & Physical: HPI:  Jennifer Trujillo is a 58 y.o. female is here for an endoscopy.   Past Medical History:  Diagnosis Date  . Asthma   . GERD (gastroesophageal reflux disease)   . Lupus [facility])     Past Surgical History:  Procedure Laterality Date  . BACK SURGERY     middle of the back    Prior to Admission medications   Medication Sig Start Date End Date Taking? Authorizing Provider  acetaminophen (TYLENOL) 500 MG tablet Take by mouth. 05/05/16  Yes [provider]  albuterol (VENTOLIN HFA) 108 (90 Base) MCG/ACT inhaler Inhale into the lungs. 08/11/09  Yes [provider]  aspirin 81 MG chewable tablet Chew by mouth.   Yes [provider]  calcium carbonate (OS-CAL) 600 MG TABS tablet Take by mouth. 03/06/15  Yes [provider]  cetirizine (ZYRTEC) 10 MG tablet Take by mouth.   Yes [provider]  Cholecalciferol (D2000 ULTRA STRENGTH) 50 MCG (2000 UT) CAPS Take by mouth.   Yes [provider]  eletriptan (RELPAX) 40 MG tablet Take by mouth. 12/22/11  Yes [provider]  Ergocalciferol (VITAMIN D2) 50 MCG (2000 UT) TABS Take by mouth.   Yes [provider]  etodolac (LODINE) 500 MG tablet Take by mouth. 08/31/17  Yes [provider]  fluticasone (FLONASE) 50 MCG/ACT nasal spray Place into the nose.   Yes [provider]  gabapentin (NEURONTIN) 100 MG capsule Take by mouth. 11/09/17 02/08/20 Yes [provider]  guaiFENesin-codeine 100-10 MG/5ML syrup Take 5 mLs by mouth every 6 (six) hours as needed for cough. 01/01/19  Yes Minna Antis, MD  hydroxychloroquine (PLAQUENIL) 200 MG tablet Take by mouth. 06/27/09  Yes [provider]  montelukast (SINGULAIR) 10 MG tablet Take by mouth. 08/11/09  Yes [provider]  omega-3 acid ethyl esters (LOVAZA) 1 g capsule Take by mouth.   Yes [provider]  omeprazole (PRILOSEC) 20 MG capsule Take 20 mg by mouth daily.   Yes [provider]  predniSONE (DELTASONE) 5 MG tablet Take by mouth.   Yes [provider]  riboflavin (VITAMIN B-2) 100 MG TABS tablet Take by mouth. 02/25/10  Yes [provider]  traMADol Janean Sark) 50 MG tablet Take by mouth. 05/02/19  Yes [provider]  hydrOXYzine (ATARAX/VISTARIL) 25 MG tablet Take 1 tablet (25 mg total) by mouth 3 (three) times daily as needed for anxiety. Patient not taking: Reported on 05/10/2019 01/01/19   Minna Antis, MD    Allergies as of 05/10/2019 - Review Complete 05/10/2019  Allergen Reaction Noted  . Daucus carota Other (See Comments) and Shortness Of Breath 11/22/2012  . Dextromethorphan-guaifenesin Other (See Comments) 06/15/2017    Family History  Problem Relation Age of Onset  . Breast cancer Neg Hx     Social History   Socioeconomic History  . Marital status: Married    Spouse name: Not on file  . Number of children: Not on file  . Years of education: Not on file  . Highest education level: Not on file  Occupational History  . Not on file  Social Needs  . Financial resource strain: Not on file  . Food insecurity    Worry: Not on  file    Inability: Not on file  . Transportation needs    Medical: Not on file    Non-medical: Not on file  Tobacco Use  . Smoking status: Never Smoker  . Smokeless tobacco: Never Used  Substance and Sexual Activity  . Alcohol use: Never    Frequency: Never  . Drug use: Never  . Sexual activity: Not on file  Lifestyle  . Physical activity    Days per week: Not on file    Minutes per session: Not on file  . Stress: Not on file  Relationships  . Social Herbalist on phone: Not on file    Gets  together: Not on file    Attends religious service: Not on file    Active member of club or organization: Not on file    Attends meetings of clubs or organizations: Not on file    Relationship status: Not on file  . Intimate partner violence    Fear of current or ex partner: Not on file    Emotionally abused: Not on file    Physically abused: Not on file    Forced sexual activity: Not on file  Other Topics Concern  . Not on file  Social History Narrative  . Not on file    Review of Systems: See HPI, otherwise negative ROS  Physical Exam: BP 139/73   Pulse 66   Temp (!) 96.5 F (35.8 C) (Tympanic)   Resp 16   Ht 5' (1.524 m)   Wt 47.6 kg   SpO2 100%   BMI 20.51 kg/m  General:   Alert,  pleasant and cooperative in NAD Head:  Normocephalic and atraumatic. Neck:  Supple; no masses or thyromegaly. Lungs:  Clear throughout to auscultation.    Heart:  Regular rate and rhythm. Abdomen:  Soft, nontender and nondistended. Normal bowel sounds, without guarding, and without rebound.   Neurologic:  Alert and  oriented x4;  grossly normal neurologically.  Impression/Plan: Jennifer Trujillo is here for an endoscopy to be performed for dysphagia  Risks, benefits, limitations, and alternatives regarding  endoscopy have been reviewed with the patient.  Questions have been answered.  All parties agreeable.   Sherri Sear, MD  05/28/2019, 8:30 AM

## 2019-05-28 NOTE — Op Note (Signed)
[facility] Gastroenterology Patient Name: Jennifer Trujillo Procedure Date: 05/28/2019 8:23 AM MRN: [id number] Account #: [id number] Date of Birth: May 13, 1961 Admit Type: Outpatient Age: 58 Room: [facility] ENDO ROOM 2 Gender: Female Note Status: Finalized Procedure:             Upper GI endoscopy Indications:           Dysphagia, Suspected gastro-esophageal reflux disease Providers:             Lin Landsman MD, MD Referring MD:          Denton Lank MD, MD (Referring MD) Medicines:             Monitored Anesthesia Care Complications:         No immediate complications. Estimated blood loss: None. Procedure:             Pre-Anesthesia Assessment:                        - Prior to the procedure, a History and Physical was                         performed, and patient medications and allergies were                         reviewed. The patient is competent. The risks and                         benefits of the procedure and the sedation options and                         risks were discussed with the patient. All questions                         were answered and informed consent was obtained.                         Patient identification and proposed procedure were                         verified by the physician, the nurse, the                         anesthesiologist, the anesthetist and the technician                         in the pre-procedure area in the procedure room in the                         endoscopy suite. Mental Status Examination: alert and                         oriented. Airway Examination: normal oropharyngeal                         airway and neck mobility. Respiratory Examination:                         clear to auscultation. CV Examination: normal.  Prophylactic Antibiotics: The patient does not require                         prophylactic antibiotics. Prior Anticoagulants: The                         patient has  taken no previous anticoagulant or                         antiplatelet agents. ASA Grade Assessment: II - A                         patient with mild systemic disease. After reviewing                         the risks and benefits, the patient was deemed in                         satisfactory condition to undergo the procedure. The                         anesthesia plan was to use monitored anesthesia care                         (MAC). Immediately prior to administration of                         medications, the patient was re-assessed for adequacy                         to receive sedatives. The heart rate, respiratory                         rate, oxygen saturations, blood pressure, adequacy of                         pulmonary ventilation, and response to care were                         monitored throughout the procedure. The physical                         status of the patient was re-assessed after the                         procedure.                        After obtaining informed consent, the endoscope was                         passed under direct vision. Throughout the procedure,                         the patient's blood pressure, pulse, and oxygen                         saturations were monitored continuously. The Endoscope  was introduced through the mouth, and advanced to the                         second part of duodenum. The upper GI endoscopy was                         accomplished without difficulty. The patient tolerated                         the procedure well. Findings:      The duodenal bulb and second portion of the duodenum were normal.      The entire examined stomach was normal.      The cardia and gastric fundus were normal on retroflexion.      The gastroesophageal junction and examined esophagus were normal.       Biopsies were taken with a cold forceps for histology.      Esophagogastric landmarks were identified: the  gastroesophageal junction       was found at 36 cm from the incisors. Impression:            - Normal duodenal bulb and second portion of the                         duodenum.                        - Normal stomach.                        - Normal gastroesophageal junction and esophagus.                         Biopsied.                        - Esophagogastric landmarks identified. Recommendation:        - Await pathology results.                        - Discharge patient to home (with escort).                        - Resume previous diet today.                        - Continue present medications. Procedure Code(s):     --- Professional ---                        [phone], Esophagogastroduodenoscopy, flexible,                         transoral; with biopsy, single or multiple Diagnosis Code(s):     --- Professional ---                        R13.10, Dysphagia, unspecified CPT copyright 2019 American Medical Association. All rights reserved. The codes documented in this report are preliminary and upon coder review may  be revised to meet current compliance requirements. Dr. Ulyess Mort Lin Landsman MD, MD 05/28/2019 8:45:12 AM This report has been signed electronically. Number of Addenda: 0 Note Initiated On:  05/28/2019 8:23 AM Estimated Blood Loss:  Estimated blood loss: none.      [facility]

## 2019-05-28 NOTE — Transfer of Care (Signed)
Immediate Anesthesia Transfer of Care Note  Patient: Jennifer Trujillo  Procedure(s) Performed: ESOPHAGOGASTRODUODENOSCOPY (EGD) WITH PROPOFOL (N/A )  Patient Location: PACU and Endoscopy Unit  Anesthesia Type:General  Level of Consciousness: drowsy and patient cooperative  Airway & Oxygen Therapy: Patient Spontanous Breathing and Patient connected to nasal cannula oxygen  Post-op Assessment: Report given to RN, Post -op Vital signs reviewed and stable and Patient moving all extremities X 4  Post vital signs: Reviewed and stable  Last Vitals:  Vitals Value Taken Time  BP 131/76 05/28/19 0848  Temp 37 C 05/28/19 0848  Pulse 62 05/28/19 0848  Resp 16 05/28/19 0848  SpO2 100 % 05/28/19 0848    Last Pain:  Vitals:   05/28/19 0846  TempSrc: Tympanic  PainSc: 0-No pain         Complications: No apparent anesthesia complications

## 2019-05-28 NOTE — Anesthesia Post-op Follow-up Note (Signed)
Anesthesia QCDR form completed.        

## 2019-05-28 NOTE — Anesthesia Postprocedure Evaluation (Signed)
Anesthesia Post Note  Patient: Jennifer Trujillo  Procedure(s) Performed: ESOPHAGOGASTRODUODENOSCOPY (EGD) WITH PROPOFOL (N/A )  Patient location during evaluation: Endoscopy Anesthesia Type: General Level of consciousness: awake and alert Pain management: pain level controlled Vital Signs Assessment: post-procedure vital signs reviewed and stable Respiratory status: spontaneous breathing and respiratory function stable Cardiovascular status: stable Anesthetic complications: no     Last Vitals:  Vitals:   05/28/19 0856 05/28/19 0906  BP: (!) 142/81 (!) 152/79  Pulse: 60 62  Resp: 14 16  Temp:    SpO2: 99% 98%    Last Pain:  Vitals:   05/28/19 0906  TempSrc:   PainSc: 0-No pain                 KEPHART,WILLIAM K

## 2019-05-29 ENCOUNTER — Encounter: Payer: Self-pay | Admitting: Gastroenterology

## 2019-05-29 LAB — SURGICAL PATHOLOGY

## 2019-11-07 ENCOUNTER — Other Ambulatory Visit: Payer: Self-pay | Admitting: Family Medicine

## 2019-11-07 DIAGNOSIS — M8580 Other specified disorders of bone density and structure, unspecified site: Secondary | ICD-10-CM

## 2019-11-07 DIAGNOSIS — Z1231 Encounter for screening mammogram for malignant neoplasm of breast: Secondary | ICD-10-CM

## 2020-03-13 ENCOUNTER — Other Ambulatory Visit: Payer: Self-pay

## 2020-03-13 ENCOUNTER — Ambulatory Visit
Admission: RE | Admit: 2020-03-13 | Discharge: 2020-03-13 | Disposition: A | Discharge status: Home / Self Care | Payer: Medicare Other | Source: Ambulatory Visit | Attending: Family Medicine | Admitting: Family Medicine

## 2020-03-13 DIAGNOSIS — M8580 Other specified disorders of bone density and structure, unspecified site: Secondary | ICD-10-CM | POA: Diagnosis not present

## 2020-03-13 DIAGNOSIS — M329 Systemic lupus erythematosus, unspecified: Secondary | ICD-10-CM | POA: Diagnosis not present

## 2020-03-13 DIAGNOSIS — Z780 Asymptomatic menopausal state: Secondary | ICD-10-CM | POA: Insufficient documentation

## 2020-03-13 DIAGNOSIS — Z1231 Encounter for screening mammogram for malignant neoplasm of breast: Secondary | ICD-10-CM | POA: Diagnosis not present

## 2020-03-13 DIAGNOSIS — Z7952 Long term (current) use of systemic steroids: Secondary | ICD-10-CM | POA: Diagnosis not present

## 2020-03-13 DIAGNOSIS — Z13820 Encounter for screening for osteoporosis: Secondary | ICD-10-CM | POA: Insufficient documentation

## 2020-10-10 IMAGING — DX PORTABLE CHEST - 1 VIEW
1 series · 1 of 1 positions shown · non-contrast
Comparison: Chest CT dated 04/20/2018

CLINICAL DATA: Shortness of breath.

EXAM:
PORTABLE CHEST 1 VIEW

[chest ap]
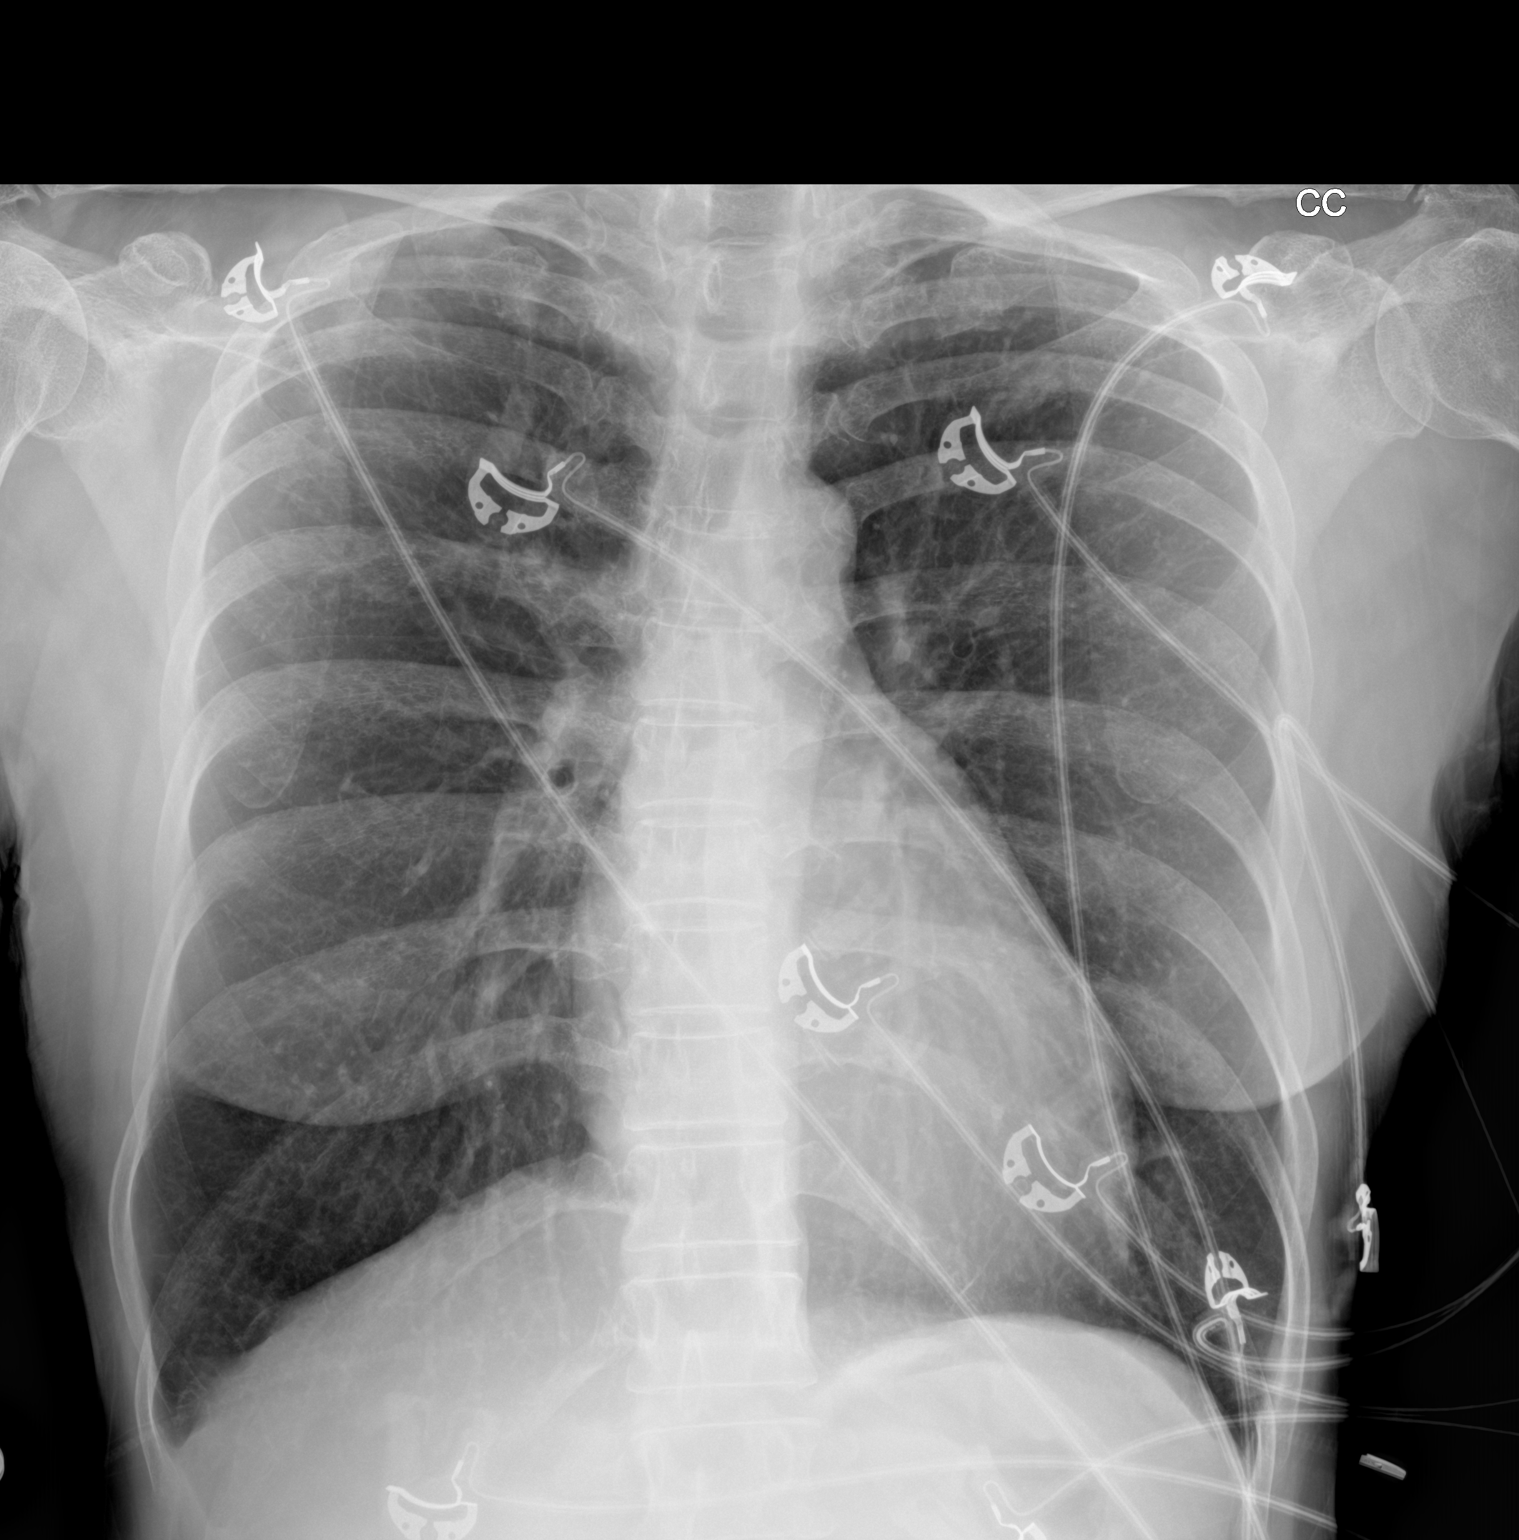

[1 of 1 positions shown; findings below may reference images not displayed]

FINDINGS: Cardiomediastinal silhouette is normal. Mediastinal contours appear
intact.

There is no evidence of focal airspace consolidation, pleural
effusion or pneumothorax.

Osseous structures are without acute abnormality. Soft tissues are
grossly normal.
IMPRESSION: No active disease.

## 2021-04-28 ENCOUNTER — Other Ambulatory Visit: Payer: Self-pay | Admitting: Family Medicine

## 2021-04-28 DIAGNOSIS — Z1231 Encounter for screening mammogram for malignant neoplasm of breast: Secondary | ICD-10-CM

## 2021-05-11 ENCOUNTER — Ambulatory Visit
Admission: RE | Admit: 2021-05-11 | Discharge: 2021-05-11 | Disposition: A | Discharge status: Home / Self Care | Payer: Medicare Other | Source: Ambulatory Visit | Attending: Family Medicine | Admitting: Family Medicine

## 2021-05-11 ENCOUNTER — Other Ambulatory Visit: Payer: Self-pay

## 2021-05-11 DIAGNOSIS — Z1231 Encounter for screening mammogram for malignant neoplasm of breast: Secondary | ICD-10-CM | POA: Diagnosis not present

## 2022-03-10 ENCOUNTER — Ambulatory Visit
Admission: EM | Admit: 2022-03-10 | Discharge: 2022-03-10 | Disposition: A | Discharge status: Home / Self Care | Payer: Medicare Other

## 2022-03-10 DIAGNOSIS — H9312 Tinnitus, left ear: Secondary | ICD-10-CM | POA: Diagnosis not present

## 2022-03-10 NOTE — Discharge Instructions (Signed)
-  No hay infeccin -Hay lquido detrs del tmpano, por lo que debes comenzar a usar [employer]. cuando el lquido se seca, esto puede [employer]. -Lo remit a un otorrinolaringlogo, pero no dude en hacer un seguimiento con su PCP. Vea si pueden ordenarle una prueba de audicin.  -There is no infection -There is fluid behind your eardrum so you should begin using flonase everyday. when the fluid is dried up, this may relieve the tinnitus.  -I did refer you to ENT specialist, but feel free to follow up with your PCP. See if they can order you a hearing test.

## 2022-03-10 NOTE — ED Triage Notes (Signed)
Pt c/o ringing LT ear x1 week, denies any pain, reports some pressure

## 2022-03-10 NOTE — ED Provider Notes (Signed)
[facility]    CSN: [id number] Arrival date & time: 03/10/22  1508      History   Chief Complaint Chief Complaint  Patient presents with   Tinnitus    HPI Jennifer Trujillo is a 61 y.o. female presenting for 2-week history of tinnitus of the left ear.  Patient says symptoms have worsened over the past week.  She reports going swimming about a month ago at her daughter's house and does not know if that is potentially related.  She says the ear is not hurting.  She does report some pressure and fullness.  She denies congestion, sore throat or cough.  She has not had any drainage from the ear.  Has used over-the-counter eardrops and taking cetirizine without relief.  History of allergies.  Patient is unsure of any hearing changes.  No other complaints.  Language interpreter service used.  HPI  Past Medical History:  Diagnosis Date   Asthma    GERD (gastroesophageal reflux disease)    Lupus (HCC)     Patient Active Problem List   Diagnosis Date Noted   COVID-19 01/08/2019   Osteopenia 01/31/2015   Systemic lupus erythematosus (HCC) 08/28/2007   Chronic GERD 08/28/2007    Past Surgical History:  Procedure Laterality Date   BACK SURGERY     middle of the back   ESOPHAGOGASTRODUODENOSCOPY (EGD) WITH PROPOFOL N/A 05/28/2019   Procedure: ESOPHAGOGASTRODUODENOSCOPY (EGD) WITH PROPOFOL;  Surgeon: Toney Reil, MD;  Location: [facility] ENDOSCOPY;  Service: Gastroenterology;  Laterality: N/A;    OB History   No obstetric history on file.      Home Medications    Prior to Admission medications   Medication Sig Start Date End Date Taking? Authorizing Provider  acetaminophen (TYLENOL) 500 MG tablet Take by mouth. 05/05/16  Yes [provider]  Benralizumab (FASENRA PEN) 30 MG/ML SOAJ Inject into the skin. 07/21/21  Yes [provider]  calcium carbonate (OS-CAL) 600 MG TABS tablet Take by mouth. 03/06/15  Yes [provider]  cetirizine  (ZYRTEC) 10 MG tablet Take by mouth.   Yes [provider]  Cholecalciferol (D2000 ULTRA STRENGTH) 50 MCG (2000 UT) CAPS Take by mouth.   Yes [provider]  eletriptan (RELPAX) 40 MG tablet Take by mouth. 12/22/11  Yes [provider]  Ergocalciferol (VITAMIN D2) 50 MCG (2000 UT) TABS Take by mouth.   Yes [provider]  fluticasone (FLONASE) 50 MCG/ACT nasal spray Place into the nose.   Yes [provider]  gabapentin (NEURONTIN) 100 MG capsule Take by mouth. 11/09/17 03/10/22 Yes [provider]  hydroxychloroquine (PLAQUENIL) 200 MG tablet Take by mouth. 06/27/09  Yes [provider]  omega-3 acid ethyl esters (LOVAZA) 1 g capsule Take by mouth.   Yes [provider]  omeprazole (PRILOSEC) 20 MG capsule Take 20 mg by mouth daily.   Yes [provider]  riboflavin (VITAMIN B-2) 100 MG TABS tablet Take by mouth. 02/25/10  Yes [provider]  albuterol (VENTOLIN HFA) 108 (90 Base) MCG/ACT inhaler Inhale into the lungs. 08/11/09   [provider]  aspirin 81 MG chewable tablet Chew by mouth.    [provider]  cyclobenzaprine (FLEXERIL) 5 MG tablet Take 5 mg by mouth at bedtime as needed. 02/12/22   [provider]  diclofenac Sodium (VOLTAREN) 1 % GEL SMARTSIG:2 Topical Twice Daily 10/09/21   [provider]  EPIPEN 2-PAK 0.3 MG/0.3ML SOAJ injection Inject into the muscle once.  12/10/21   [provider]  etodolac (LODINE) 500 MG tablet Take by mouth. 08/31/17   [provider]  guaiFENesin-codeine 100-10 MG/5ML syrup Take 5 mLs by mouth every 6 (six) hours as needed for cough. 01/01/19   Minna Antis, MD  hydrOXYzine (ATARAX/VISTARIL) 25 MG tablet Take 1 tablet (25 mg total) by mouth 3 (three) times daily as needed for anxiety. Patient not taking: Reported on 05/10/2019 01/01/19   Minna Antis, MD  montelukast (SINGULAIR) 10 MG tablet Take by mouth.  08/11/09   [provider]  predniSONE (DELTASONE) 5 MG tablet Take by mouth.    [provider]  traMADol Janean Sark) 50 MG tablet Take by mouth. 05/02/19   [provider]  triamcinolone cream (KENALOG) 0.1 % Apply topically. 02/08/22   [provider]    Family History Family History  Problem Relation Age of Onset   Breast cancer Neg Hx     Social History Social History   Tobacco Use   Smoking status: Never   Smokeless tobacco: Never  Vaping Use   Vaping Use: Never used  Substance Use Topics   Alcohol use: Never   Drug use: Never     Allergies   Daucus carota and Dextromethorphan-guaifenesin   Review of Systems Review of Systems  Constitutional:  Negative for fever.  HENT:  Negative for congestion, ear discharge, ear pain, hearing loss, rhinorrhea and sore throat.        +tinnitus left ear  Respiratory:  Negative for cough.   Neurological:  Negative for dizziness and headaches.     Physical Exam Triage Vital Signs ED Triage Vitals [03/10/22 1522]  Enc Vitals Group     BP      Pulse      Resp      Temp      Temp src      SpO2      Weight 109 lb (49.4 kg)     Height 5' (1.524 m)     Head Circumference      Peak Flow      Pain Score 0     Pain Loc      Pain Edu?      Excl. in GC?    No data found.  Updated Vital Signs BP 127/79 (BP Location: Left Arm)   Pulse 68   Temp 98.3 F (36.8 C) (Oral)   Ht 5' (1.524 m)   Wt 109 lb (49.4 kg)   SpO2 97%   BMI 21.29 kg/m      Physical Exam Vitals and nursing note reviewed.  Constitutional:      General: She is not in acute distress.    Appearance: Normal appearance. She is not ill-appearing or toxic-appearing.  HENT:     Head: Normocephalic and atraumatic.     Right Ear: Ear canal and external ear normal. A middle ear effusion is present.     Left Ear: Ear canal and external ear normal. A middle ear effusion is present.     Nose: Congestion present.     Comments:  Deviated septum    Mouth/Throat:     Mouth: Mucous membranes are moist.     Pharynx: Oropharynx is clear.  Eyes:     General: No scleral icterus.       Right eye: No discharge.        Left eye: No discharge.     Conjunctiva/sclera: Conjunctivae normal.  Cardiovascular:     Rate and Rhythm:  Normal rate and regular rhythm.     Heart sounds: Normal heart sounds.  Pulmonary:     Effort: Pulmonary effort is normal. No respiratory distress.     Breath sounds: Normal breath sounds.  Musculoskeletal:     Cervical back: Neck supple.  Skin:    General: Skin is dry.  Neurological:     General: No focal deficit present.     Mental Status: She is alert. Mental status is at baseline.     Motor: No weakness.     Gait: Gait normal.  Psychiatric:        Mood and Affect: Mood normal.        Behavior: Behavior normal.        Thought Content: Thought content normal.      UC Treatments / Results  Labs (all labs ordered are listed, but only abnormal results are displayed) Labs Reviewed - No data to display  EKG   Radiology No results found.  Procedures Procedures (including critical care time)  Medications Ordered in UC Medications - No data to display  Initial Impression / Assessment and Plan / UC Course  I have reviewed the triage vital signs and the nursing notes.  Pertinent labs & imaging results that were available during my care of the patient were reviewed by me and considered in my medical decision making (see chart for details).   61 year old female presenting for tinnitus of the left ear and fullness.  Denies fever, pain, drainage.  No report of congestion and cough.  Does not report any hearing changes.  Vitals are stable and she is overall well-appearing.  Nasal congestion on exam clear effusion of left TM and nasal congestion with deviated symptom, otherwise normal exam.  Advised patient to continue cetirizine and start using Flonase.  The effusion may be the cause of  the tinnitus.  Patient has lost satisfied with this.  She request eardrops.  I explained to her that your drops will not be helpful as she does not have an infection.  There is also no evidence of cerumen impaction.  I did place a referral to ENT and also advised patient to follow-up with PCP.   Final Clinical Impressions(s) / UC Diagnoses   Final diagnoses:  Tinnitus of left ear     Discharge Instructions      -No hay infeccin -Hay lquido detrs del tmpano, por lo que debes comenzar a usar [employer]. cuando el lquido se seca, esto puede [employer]. -Lo remit a un otorrinolaringlogo, pero no dude en hacer un seguimiento con su PCP. Vea si pueden ordenarle una prueba de audicin.  -There is no infection -There is fluid behind your eardrum so you should begin using flonase everyday. when the fluid is dried up, this may relieve the tinnitus.  -I did refer you to ENT specialist, but feel free to follow up with your PCP. See if they can order you a hearing test.      ED Prescriptions   None    PDMP not reviewed this encounter.   Shirlee Latch, PA-C 03/10/22 1606

## 2022-03-26 ENCOUNTER — Other Ambulatory Visit: Payer: Self-pay | Admitting: Family Medicine

## 2022-03-26 DIAGNOSIS — Z780 Asymptomatic menopausal state: Secondary | ICD-10-CM

## 2022-03-26 DIAGNOSIS — Z1231 Encounter for screening mammogram for malignant neoplasm of breast: Secondary | ICD-10-CM

## 2022-05-13 ENCOUNTER — Ambulatory Visit
Admission: RE | Admit: 2022-05-13 | Discharge: 2022-05-13 | Disposition: A | Discharge status: Home / Self Care | Payer: Medicare Other | Source: Ambulatory Visit | Attending: Family Medicine | Admitting: Family Medicine

## 2022-05-13 DIAGNOSIS — Z780 Asymptomatic menopausal state: Secondary | ICD-10-CM | POA: Diagnosis present

## 2022-05-13 DIAGNOSIS — Z1231 Encounter for screening mammogram for malignant neoplasm of breast: Secondary | ICD-10-CM | POA: Diagnosis present

## 2023-02-18 IMAGING — MG MM DIGITAL SCREENING BILAT W/ TOMO AND CAD
8 series · 9 of 24 positions shown · non-contrast
Comparison: Previous exam(s).

CLINICAL DATA: Screening.

EXAM:
DIGITAL SCREENING BILATERAL MAMMOGRAM WITH TOMOSYNTHESIS AND CAD
TECHNIQUE: Bilateral screening digital craniocaudal and mediolateral oblique
mammograms were obtained. Bilateral screening digital breast
tomosynthesis was performed. The images were evaluated with
computer-aided detection.

[R CC synth-2D]
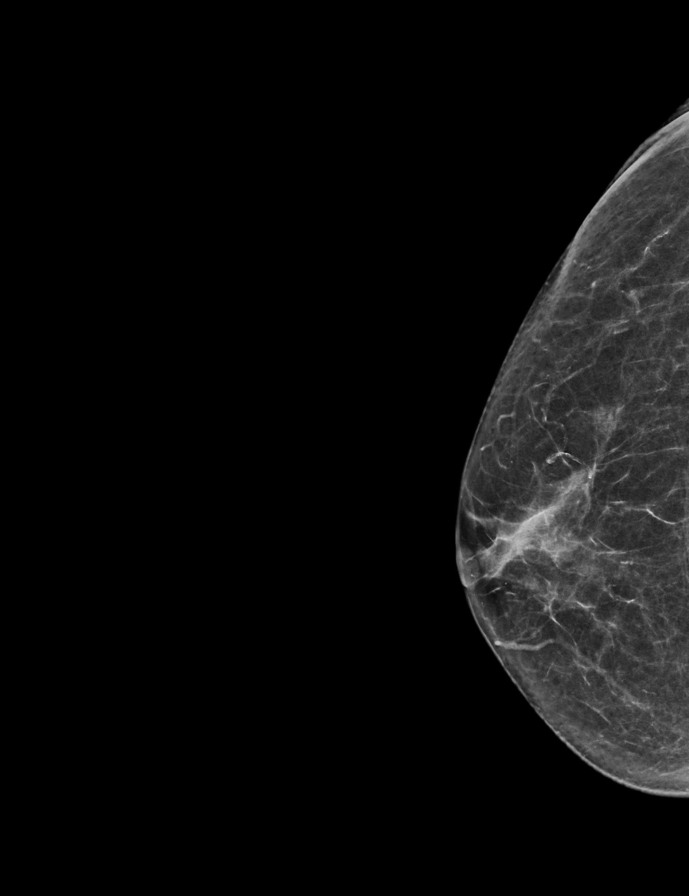

[L CC synth-2D]
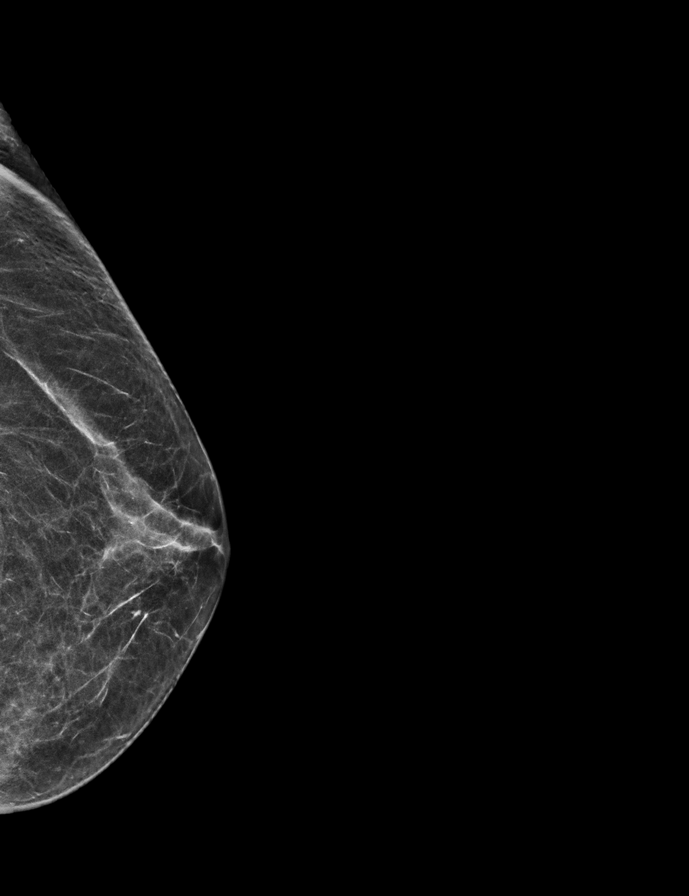

[L MLO synth-2D]
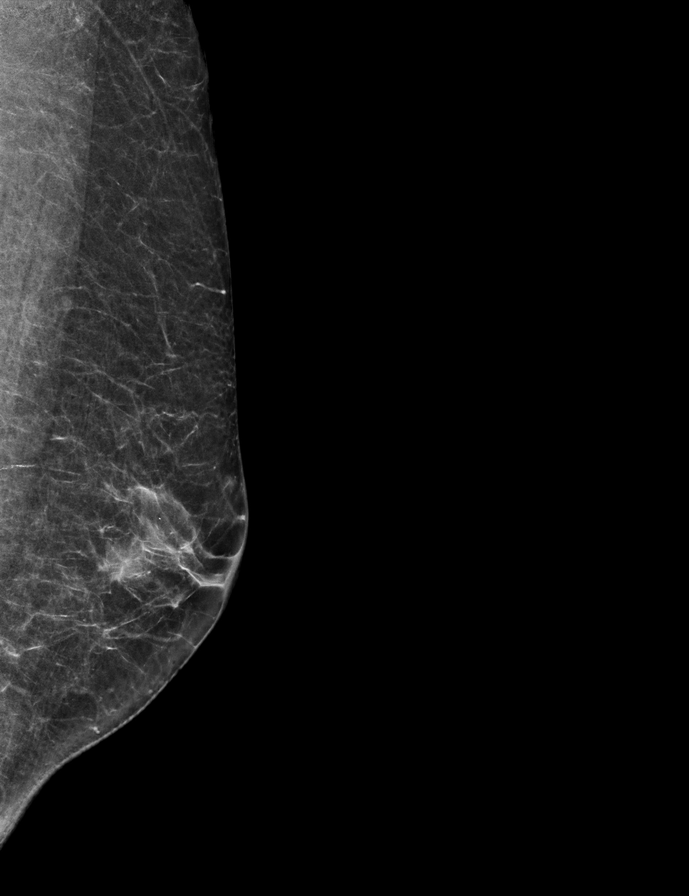

[R MLO synth-2D]
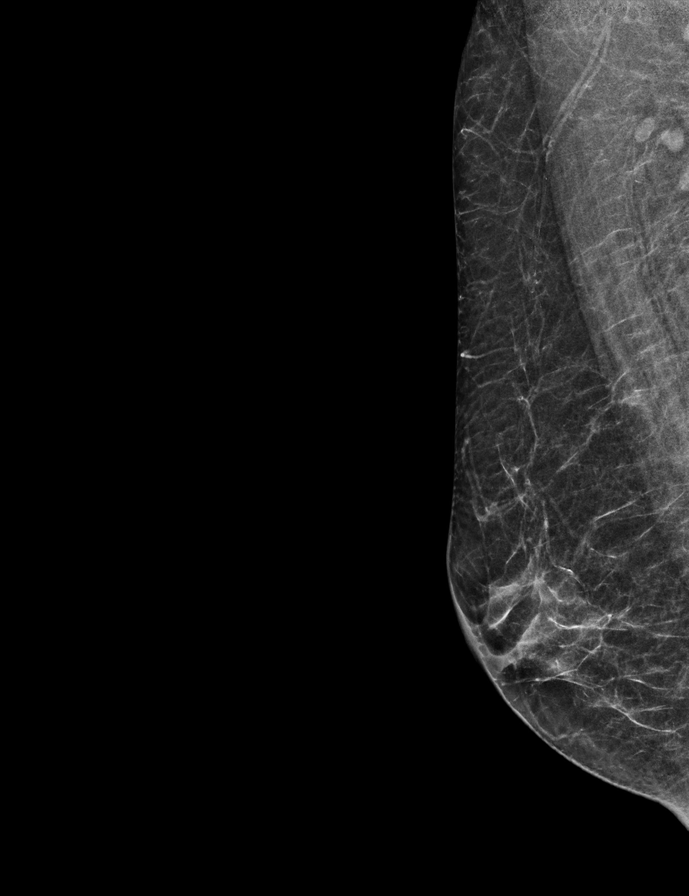

[L CC tomo · 2 of 50 frames shown]
[frame 17/50]
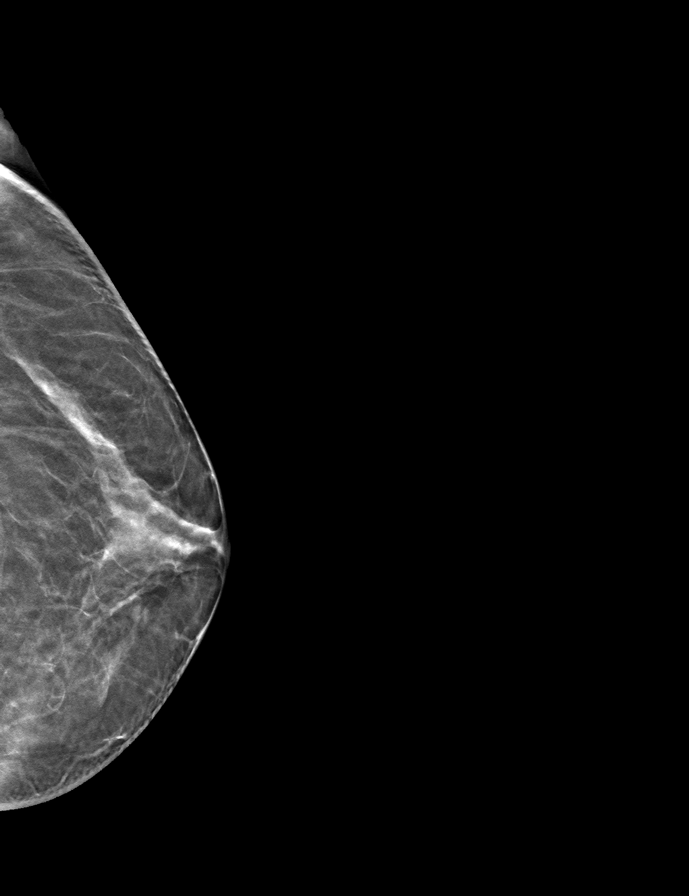
[frame 25/50]
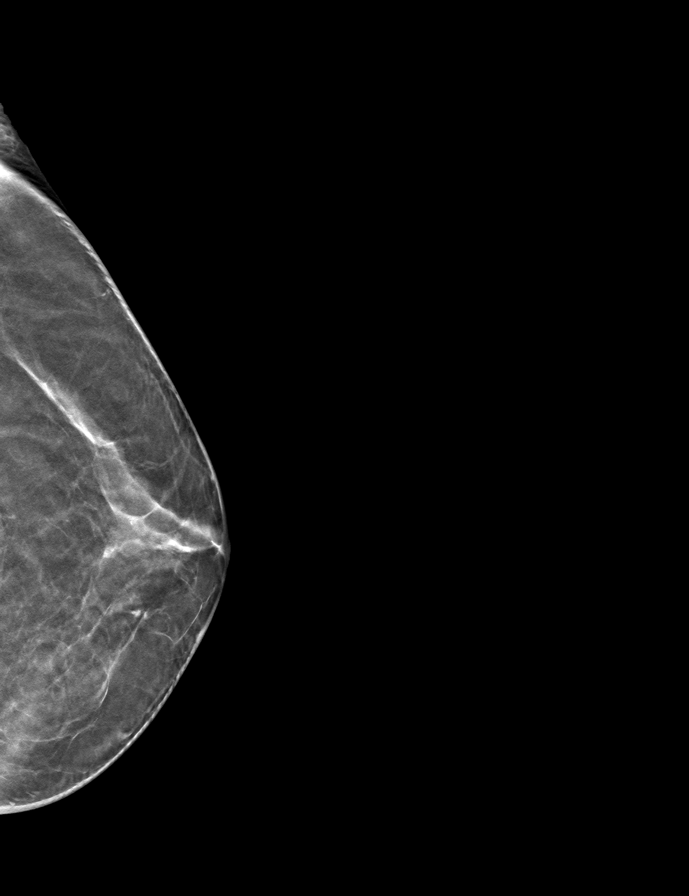

[R MLO tomo · tomo slice 25/49.0]
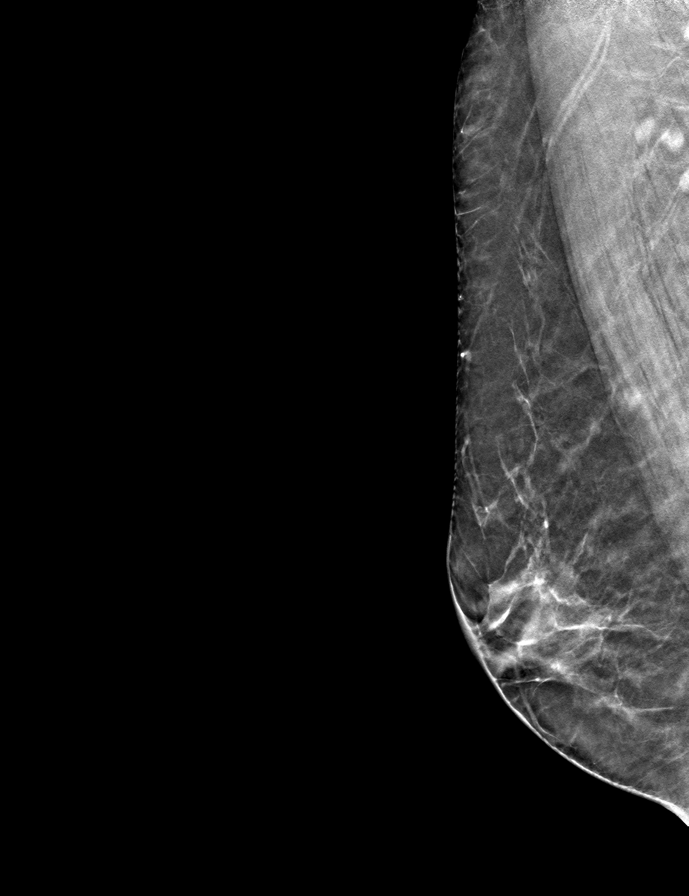

[R CC tomo · tomo slice 29/58.0]
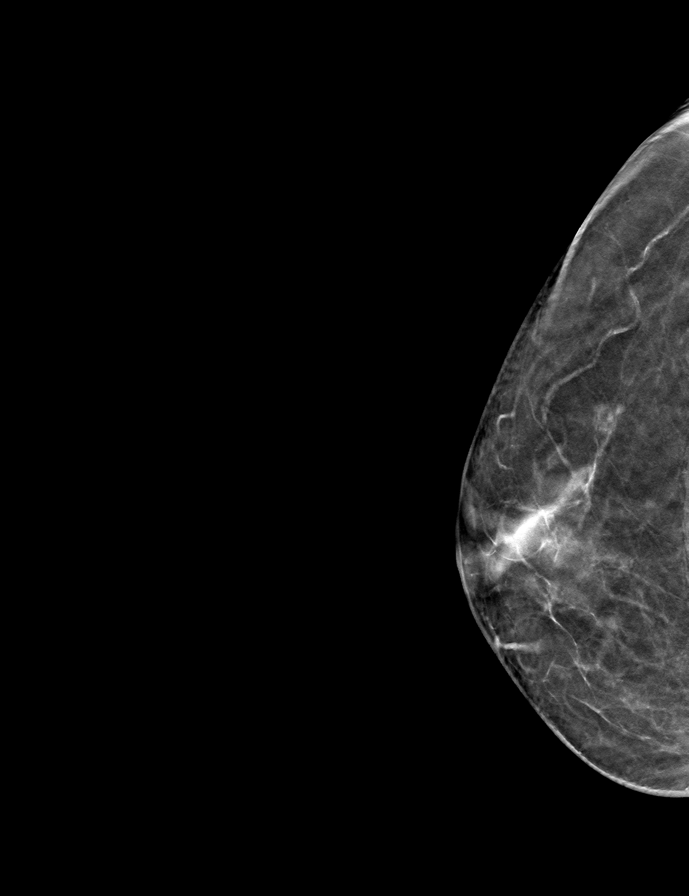

[L MLO tomo · tomo slice 29/56.0]
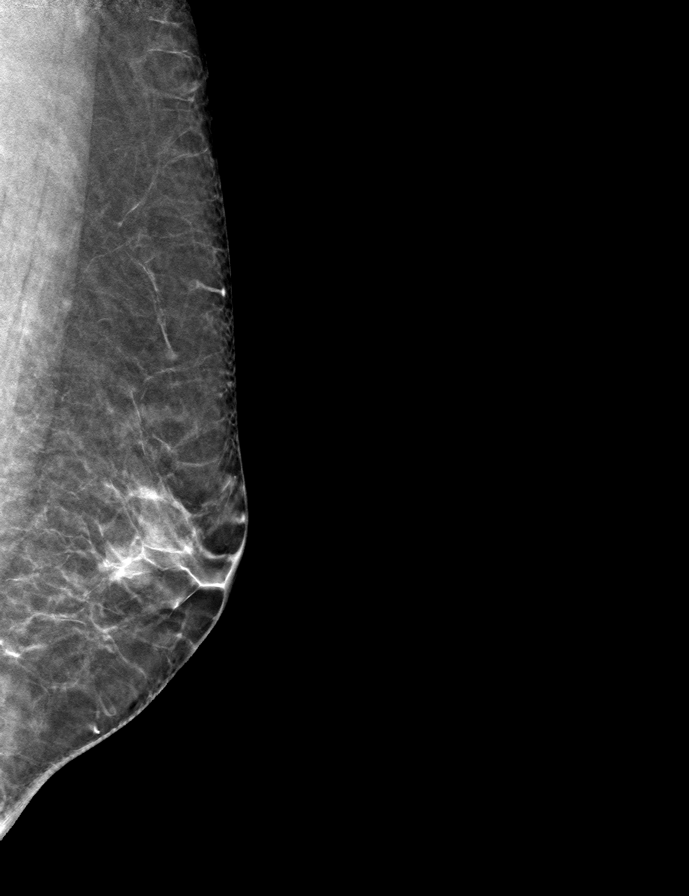

[9 of 24 positions shown; findings below may reference images not displayed]

ACR Breast Density Category b: There are scattered areas of
fibroglandular density.
FINDINGS: There are no findings suspicious for malignancy.
IMPRESSION: No mammographic evidence of malignancy. A result letter of this
screening mammogram will be mailed directly to the patient.

RECOMMENDATION:
Screening mammogram in one year. (Code:51-O-LD2)

BI-RADS CATEGORY  1: Negative.

## 2023-05-10 ENCOUNTER — Other Ambulatory Visit: Payer: Self-pay | Admitting: Family Medicine

## 2023-05-10 DIAGNOSIS — Z1231 Encounter for screening mammogram for malignant neoplasm of breast: Secondary | ICD-10-CM

## 2023-05-18 ENCOUNTER — Ambulatory Visit
Admission: RE | Admit: 2023-05-18 | Discharge: 2023-05-18 | Disposition: A | Discharge status: Home / Self Care | Payer: Medicare Other | Source: Ambulatory Visit | Attending: Family Medicine | Admitting: Family Medicine

## 2023-05-18 DIAGNOSIS — Z1231 Encounter for screening mammogram for malignant neoplasm of breast: Secondary | ICD-10-CM | POA: Insufficient documentation

## 2024-01-03 ENCOUNTER — Ambulatory Visit: Attending: Cardiology | Admitting: Cardiology

## 2024-01-03 ENCOUNTER — Encounter: Payer: Self-pay | Admitting: Cardiology

## 2024-01-03 VITALS — BP 120/68 | HR 62 | Ht 60.0 in | Wt 120.5 lb

## 2024-01-03 DIAGNOSIS — R072 Precordial pain: Secondary | ICD-10-CM | POA: Diagnosis not present

## 2024-01-03 MED ORDER — METOPROLOL TARTRATE 100 MG PO TABS: ORAL_TABLET | ORAL | 0 refills | Status: DC

## 2024-01-03 NOTE — Progress Notes (Signed)
 Cardiology Office Note:    Date:  01/03/2024   ID:  Jennifer Trujillo, DOB 31-Jan-1961, MRN [id number]  PCP:  Comer Decamp, MD   [facility] Providers Cardiologist:  None     Referring MD: Comer Decamp, MD   Chief Complaint  Patient presents with   New Patient (Initial Visit)    Chest pain c/o chest pressure and back pain. Meds reviewed verbally with pt.    History of Present Illness:    Jennifer Trujillo is a 63 y.o. female with a hx of GERD who presents with chest pain.  States having symptoms of left-sided chest pain ongoing over the past 6 weeks.  Symptoms are not associated with exertion.  Laying on her left side sometimes causes chest pressure, although symptoms occur sporadically.  Father had cardiac surgery in his 70s to 4s, unsure reason.  Denies any family history of heart attacks.  Patient states having borderline diabetes, denies smoking.  Past Medical History:  Diagnosis Date   Asthma    GERD (gastroesophageal reflux disease)    Lupus     Past Surgical History:  Procedure Laterality Date   BACK SURGERY     middle of the back   ESOPHAGOGASTRODUODENOSCOPY (EGD) WITH PROPOFOL  N/A 05/28/2019   Procedure: ESOPHAGOGASTRODUODENOSCOPY (EGD) WITH PROPOFOL ;  Surgeon: Selena Daily, MD;  Location: [facility] END[facility]COPY;  Service: Gastroenterology;  Laterality: N/A;    Current Medications: Current Meds  Medication Sig   acetaminophen (TYLENOL) 500 MG tablet Take by mouth.   albuterol (VENTOLIN HFA) 108 (90 Base) MCG/ACT inhaler Inhale into the lungs.   Benralizumab (FASENRA PEN) 30 MG/ML SOAJ Inject into the skin.   calcium carbonate (OS-CAL) 600 MG TABS tablet Take by mouth.   celecoxib (CELEBREX) 100 MG capsule Take 100 mg by mouth daily.   cetirizine (ZYRTEC) 10 MG tablet Take by mouth.   Cholecalciferol (D2000 ULTRA STRENGTH) 50 MCG (2000 UT) CAPS Take by mouth.   cyclobenzaprine (FLEXERIL) 5 MG tablet Take 5 mg by mouth at bedtime as needed.   diclofenac  Sodium (VOLTAREN) 1 % GEL SMARTSIG:2 Topical Twice Daily   DULoxetine (CYMBALTA) 20 MG capsule Take 20 mg by mouth daily.   EPIPEN 2-PAK 0.3 MG/0.3ML SOAJ injection Inject into the muscle once.   etodolac (LODINE) 500 MG tablet Take by mouth.   fluticasone (FLONASE) 50 MCG/ACT nasal spray Place into the nose.   gabapentin (NEURONTIN) 100 MG capsule Take by mouth.   hydroxychloroquine (PLAQUENIL) 200 MG tablet Take by mouth.   hydrOXYzine  (ATARAX /VISTARIL ) 25 MG tablet Take 1 tablet (25 mg total) by mouth 3 (three) times daily as needed for anxiety.   metoprolol tartrate (LOPRESSOR) 100 MG tablet TAKE 1 TABLET 2 HR PRIOR TO CARDIAC PROCEDURE   omega-3 acid ethyl esters (LOVAZA) 1 g capsule Take by mouth.   omeprazole (PRILOSEC) 20 MG capsule Take 20 mg by mouth daily.   riboflavin (VITAMIN B-2) 100 MG TABS tablet Take by mouth.   SUMAtriptan (IMITREX) 50 MG tablet Take 50 mg by mouth as needed.   traMADol (ULTRAM) 50 MG tablet Take by mouth.   triamcinolone cream (KENALOG) 0.1 % Apply topically.     Allergies:   Daucus carota and Dextromethorphan-guaifenesin    Social History   Socioeconomic History   Marital status: Married    Spouse name: Not on file   Number of children: Not on file   Years of education: Not on file   Highest education level: Not on file  Occupational History  Not on file  Tobacco Use   Smoking status: Never   Smokeless tobacco: Never  Vaping Use   Vaping status: Never Used  Substance and Sexual Activity   Alcohol use: Never   Drug use: Never   Sexual activity: Not on file  Other Topics Concern   Not on file  Social History Narrative   Not on file   Social Drivers of Health   Financial Resource Strain: Low Risk  (09/23/2023)   Received from [facility]   Overall Financial Resource Strain (CARDIA)    Difficulty of Paying Living Expenses: Not hard at all  Food Insecurity: No Food Insecurity (09/23/2023)   Received from [facility]   Hunger Vital Sign    Within the past 12 months, you worried that your food would run out before you got the money to buy more.: Never true    Within the past 12 months, the food you bought just didn't last and you didn't have money to get more.: Never true  Transportation Needs: No Transportation Needs (09/23/2023)   Received from [facility]    In the past 12 months, has lack of transportation kept you from medical appointments or from getting medications?: No    Lack of Transportation (Non-Medical): No  Physical Activity: Not on file  Stress: Not on file  Social Connections: Unknown (09/02/2022)   [facility]   Social Network    Social Network: Not on file     Family History: The patient's family history is negative for Breast cancer.  ROS:   Please see the history of present illness.     All other systems reviewed and are negative.  EKGs/Labs/Other Studies Reviewed:    The following studies were reviewed today:       Recent Labs: No results found for requested labs within last 365 days.  Recent Lipid Panel No results found for: CHOL, TRIG, HDL, CHOLHDL, VLDL, LDLCALC, LDLDIRECT   Risk Assessment/Calculations:             Physical Exam:    VS:  BP 120/68 (BP Location: Right Arm, Patient Position: Sitting, Cuff Size: Normal)   Pulse 62   Ht 5' (1.524 m)   Wt 120 lb 8 oz (54.7 kg)   SpO2 99%   BMI 23.53 kg/m     Wt Readings from Last 3 Encounters:  01/03/24 120 lb 8 oz (54.7 kg)  03/10/22 109 lb (49.4 kg)  05/28/19 105 lb (47.6 kg)     GEN:  Well nourished, well developed in no acute distress HEENT: Normal NECK: No JVD; No carotid bruits CARDIAC: RRR, no murmurs, rubs, gallops RESPIRATORY:  Clear to auscultation without rales, wheezing or rhonchi  ABDOMEN: Soft, non-tender, non-distended MUSCULOSKELETAL:  No edema; No deformity  SKIN: Warm and dry NEUROLOGIC:  Alert  and oriented x 3 PSYCHIATRIC:  Normal affect   ASSESSMENT:    1. Precordial pain    PLAN:    In order of problems listed above:  Chest pain, appears atypical.  Plan workup with echocardiogram, coronary CTA.  Reassured patient if no significant abnormalities noted.  Follow-up after cardiac testing     Medication Adjustments/Labs and Tests Ordered: Current medicines are reviewed at length with the patient today.  Concerns regarding medicines are outlined above.  Orders Placed This Encounter  Procedures   CT CORONARY MORPH W/CTA COR W/SCORE W/CA W/CM &/OR WO/CM   Basic  metabolic panel with GFR   EKG [id number]   ECHOCARDIOGRAM COMPLETE   Meds ordered this encounter  Medications   metoprolol tartrate (LOPRESSOR) 100 MG tablet    Sig: TAKE 1 TABLET 2 HR PRIOR TO CARDIAC PROCEDURE    Dispense:  1 tablet    Refill:  0    Patient Instructions  Medication Instructions:   Your physician recommends that you continue on your current medications as directed. Please refer to the Current Medication list given to you today.   CTA medication: Metoprolol 100 mg by mouth on the day of procedure.  *If you need a refill on your cardiac medications before your next appointment, please call your pharmacy*  Lab Work: Your provider would like for you to have following labs drawn today BMET.    If you have labs (blood work) drawn today and your tests are completely normal, you will receive your results only by: MyChart Message (if you have MyChart) OR A paper copy in the mail If you have any lab test that is abnormal or we need to change your treatment, we will call you to review the results.  Testing/Procedures:    Your cardiac CT will be scheduled at   [facility] [address] [place], [place] [address] [phone]  Please arrive 15 mins early for check-in and test prep.  There is spacious parking and easy access to the radiology department from the  [facility] entrance. Please enter here and check-in with the desk attendant.   Please follow these instructions carefully (unless otherwise directed):  An IV will be required for this test and Nitroglycerin will be given.  Hold all erectile dysfunction medications at least 3 days (72 hrs) prior to test. (Ie viagra, cialis, sildenafil, tadalafil, etc)   On the Night Before the Test: Be sure to Drink plenty of water. Do not consume any caffeinated/decaffeinated beverages or chocolate 12 hours prior to your test. Do not take any antihistamines 12 hours prior to your test.   On the Day of the Test: Drink plenty of water until 1 hour prior to the test. Do not eat any food 1 hour prior to test. You may take your regular medications prior to the test.  Take metoprolol (Lopressor) two hours prior to test. If you take Furosemide/Hydrochlorothiazide/Spironolactone/Chlorthalidone, please HOLD on the morning of the test. Patients who wear a continuous glucose monitor MUST remove the device prior to scanning. FEMALES- please wear underwire-free bra if available, avoid dresses & tight clothing  After the Test: Drink plenty of water. After receiving IV contrast, you may experience a mild flushed feeling. This is normal. On occasion, you may experience a mild rash up to 24 hours after the test. This is not dangerous. If this occurs, you can take Benadryl 25 mg, Zyrtec, Claritin, or Allegra and increase your fluid intake. (Patients taking Tikosyn should avoid Benadryl, and may take Zyrtec, Claritin, or Allegra) If you experience trouble breathing, this can be serious. If it is severe call 911 IMMEDIATELY. If it is mild, please call our office.  We will call to schedule your test 2-4 weeks out understanding that some insurance companies will need an authorization prior to the service being performed.   For more information and frequently asked questions, please visit our website :  [email or web address]  For non-scheduling related questions, please contact the cardiac imaging nurse navigator should you have any questions/concerns: Cardiac Imaging Nurse Navigators Direct Office Dial: [phone]   For  scheduling needs, including cancellations and rescheduling, please call [place], [phone].     Your physician has requested that you have an echocardiogram. Echocardiography is a painless test that uses sound waves to create images of your heart. It provides your doctor with information about the size and shape of your heart and how well your heart's chambers and valves are working.   You may receive an ultrasound enhancing agent through an IV if needed to better visualize your heart during the echo. This procedure takes approximately one hour.  There are no restrictions for this procedure.  This will take place at [address] [facility] [address], [place] [address]  Please note: We ask at that you not bring children with you during ultrasound (echo/ vascular) testing. Due to room size and safety concerns, children are not allowed in the ultrasound rooms during exams. Our front office staff cannot provide observation of children in our lobby area while testing is being conducted. An adult accompanying a patient to their appointment will only be allowed in the ultrasound room at the discretion of the ultrasound technician under special circumstances. We apologize for any inconvenience.  Follow-Up: At [facility], you and your health needs are our priority.  As part of our continuing mission to provide you with exceptional heart care, our providers are all part of one team.  This team includes your primary Cardiologist (physician) and Advanced Practice Providers or APPs (Physician Assistants and Nurse Practitioners) who all work together to provide you with the care you need, when you need it.  Your next appointment:   3  month(s)  Provider:   You may see Constancia Delton or one of the following Advanced Practice Providers on your designated Care Team:   Laneta Pintos, NP Gildardo Labrador, PA-C Varney Gentleman, PA-C Cadence [place], PA-C Ronald Cockayne, NP Morey Ar, NP    We recommend signing up for the patient portal called MyChart.  Sign up information is provided on this After Visit Summary.  MyChart is used to connect with patients for Virtual Visits (Telemedicine).  Patients are able to view lab/test results, encounter notes, upcoming appointments, etc.  Non-urgent messages can be sent to your provider as well.   To learn more about what you can do with MyChart, go to [email or web address].         Signed, Constancia Delton, MD  01/03/2024 4:33 PM    [facility]

## 2024-01-03 NOTE — Patient Instructions (Addendum)
 Medication Instructions:   Your physician recommends that you continue on your current medications as directed. Please refer to the Current Medication list given to you today.   CTA medication: Metoprolol 100 mg by mouth on the day of procedure.  *If you need a refill on your cardiac medications before your next appointment, please call your pharmacy*  Lab Work: Your provider would like for you to have following labs drawn today BMET.    If you have labs (blood work) drawn today and your tests are completely normal, you will receive your results only by: MyChart Message (if you have MyChart) OR A paper copy in the mail If you have any lab test that is abnormal or we need to change your treatment, we will call you to review the results.  Testing/Procedures:    Your cardiac CT will be scheduled at   [facility] [address] [place], [place] [address] [phone]  Please arrive 15 mins early for check-in and test prep.  There is spacious parking and easy access to the radiology department from the [facility] entrance. Please enter here and check-in with the desk attendant.   Please follow these instructions carefully (unless otherwise directed):  An IV will be required for this test and Nitroglycerin will be given.  Hold all erectile dysfunction medications at least 3 days (72 hrs) prior to test. (Ie viagra, cialis, sildenafil, tadalafil, etc)   On the Night Before the Test: Be sure to Drink plenty of water. Do not consume any caffeinated/decaffeinated beverages or chocolate 12 hours prior to your test. Do not take any antihistamines 12 hours prior to your test.   On the Day of the Test: Drink plenty of water until 1 hour prior to the test. Do not eat any food 1 hour prior to test. You may take your regular medications prior to the test.  Take metoprolol (Lopressor) two hours prior to test. If you take  Furosemide/Hydrochlorothiazide/Spironolactone/Chlorthalidone, please HOLD on the morning of the test. Patients who wear a continuous glucose monitor MUST remove the device prior to scanning. FEMALES- please wear underwire-free bra if available, avoid dresses & tight clothing  After the Test: Drink plenty of water. After receiving IV contrast, you may experience a mild flushed feeling. This is normal. On occasion, you may experience a mild rash up to 24 hours after the test. This is not dangerous. If this occurs, you can take Benadryl 25 mg, Zyrtec, Claritin, or Allegra and increase your fluid intake. (Patients taking Tikosyn should avoid Benadryl, and may take Zyrtec, Claritin, or Allegra) If you experience trouble breathing, this can be serious. If it is severe call 911 IMMEDIATELY. If it is mild, please call our office.  We will call to schedule your test 2-4 weeks out understanding that some insurance companies will need an authorization prior to the service being performed.   For more information and frequently asked questions, please visit our website : [email or web address]  For non-scheduling related questions, please contact the cardiac imaging nurse navigator should you have any questions/concerns: Cardiac Imaging Nurse Navigators Direct Office Dial: [phone]   For scheduling needs, including cancellations and rescheduling, please call [place], [phone].     Your physician has requested that you have an echocardiogram. Echocardiography is a painless test that uses sound waves to create images of your heart. It provides your doctor with information about the size and shape of your heart and how well your heart's chambers and valves are  working.   You may receive an ultrasound enhancing agent through an IV if needed to better visualize your heart during the echo. This procedure takes approximately one hour.  There are no restrictions for this procedure.  This  will take place at [address] [facility], [place] [address]  Please note: We ask at that you not bring children with you during ultrasound (echo/ vascular) testing. Due to room size and safety concerns, children are not allowed in the ultrasound rooms during exams. Our front office staff cannot provide observation of children in our lobby area while testing is being conducted. An adult accompanying a patient to their appointment will only be allowed in the ultrasound room at the discretion of the ultrasound technician under special circumstances. We apologize for any inconvenience.  Follow-Up: At [facility], you and your health needs are our priority.  As part of our continuing mission to provide you with exceptional heart care, our providers are all part of one team.  This team includes your primary Cardiologist (physician) and Advanced Practice Providers or APPs (Physician Assistants and Nurse Practitioners) who all work together to provide you with the care you need, when you need it.  Your next appointment:   3 month(s)  Provider:   You may see Constancia Delton or one of the following Advanced Practice Providers on your designated Care Team:   Laneta Pintos, NP Gildardo Labrador, PA-C Varney Gentleman, PA-C Cadence Grafton, PA-C Ronald Cockayne, NP Morey Ar, NP    We recommend signing up for the patient portal called MyChart.  Sign up information is provided on this After Visit Summary.  MyChart is used to connect with patients for Virtual Visits (Telemedicine).  Patients are able to view lab/test results, encounter notes, upcoming appointments, etc.  Non-urgent messages can be sent to your provider as well.   To learn more about what you can do with MyChart, go to [email or web address].

## 2024-01-10 ENCOUNTER — Ambulatory Visit: Attending: Cardiology

## 2024-01-10 DIAGNOSIS — R072 Precordial pain: Secondary | ICD-10-CM

## 2024-01-10 LAB — ECHOCARDIOGRAM COMPLETE
AR max vel: 1.97 cm2
AV Area VTI: 1.87 cm2
AV Area mean vel: 1.83 cm2
AV Mean grad: 3 mmHg
AV Peak grad: 6.4 mmHg
Ao pk vel: 1.26 m/s
Area-P 1/2: 3.6 cm2
S' Lateral: 3.05 cm

## 2024-01-11 LAB — BASIC METABOLIC PANEL WITH GFR
BUN/Creatinine Ratio: 13 (ref 12–28)
BUN: 9 mg/dL (ref 8–27)
CO2: 22 mmol/L (ref 20–29)
Calcium: 9.3 mg/dL (ref 8.7–10.3)
Chloride: 104 mmol/L (ref 96–106)
Creatinine, Ser: 0.7 mg/dL (ref 0.57–1.00)
Glucose: 85 mg/dL (ref 70–99)
Potassium: 4.4 mmol/L (ref 3.5–5.2)
Sodium: 141 mmol/L (ref 134–144)
eGFR: 98 mL/min/{1.73_m2} (ref >59)

## 2024-01-13 ENCOUNTER — Other Ambulatory Visit: Payer: Self-pay | Admitting: Orthopedic Surgery

## 2024-01-13 DIAGNOSIS — M542 Cervicalgia: Secondary | ICD-10-CM

## 2024-01-13 DIAGNOSIS — M5412 Radiculopathy, cervical region: Secondary | ICD-10-CM

## 2024-01-13 DIAGNOSIS — M5030 Other cervical disc degeneration, unspecified cervical region: Secondary | ICD-10-CM

## 2024-01-13 DIAGNOSIS — M47812 Spondylosis without myelopathy or radiculopathy, cervical region: Secondary | ICD-10-CM

## 2024-01-14 ENCOUNTER — Emergency Department

## 2024-01-14 ENCOUNTER — Other Ambulatory Visit: Payer: Self-pay

## 2024-01-14 ENCOUNTER — Emergency Department
Admission: EM | Admit: 2024-01-14 | Discharge: 2024-01-14 | Disposition: A | Discharge status: Home / Self Care | Attending: Emergency Medicine | Admitting: Emergency Medicine

## 2024-01-14 DIAGNOSIS — R531 Weakness: Secondary | ICD-10-CM | POA: Insufficient documentation

## 2024-01-14 DIAGNOSIS — M791 Myalgia, unspecified site: Secondary | ICD-10-CM | POA: Insufficient documentation

## 2024-01-14 DIAGNOSIS — R519 Headache, unspecified: Secondary | ICD-10-CM | POA: Diagnosis not present

## 2024-01-14 DIAGNOSIS — F419 Anxiety disorder, unspecified: Secondary | ICD-10-CM | POA: Insufficient documentation

## 2024-01-14 LAB — BASIC METABOLIC PANEL WITH GFR
Anion gap: 13 (ref 5–15)
BUN: 14 mg/dL (ref 8–23)
CO2: 17 mmol/L — ABNORMAL LOW (ref 22–32)
Calcium: 9 mg/dL (ref 8.9–10.3)
Chloride: 107 mmol/L (ref 98–111)
Creatinine, Ser: 0.56 mg/dL (ref 0.44–1.00)
GFR, Estimated: >60 mL/min (ref >60)
Glucose, Bld: 98 mg/dL (ref 70–99)
Potassium: 3.9 mmol/L (ref 3.5–5.1)
Sodium: 137 mmol/L (ref 135–145)

## 2024-01-14 LAB — CBC
HCT: 39.6 % (ref 36.0–46.0)
Hemoglobin: 13.7 g/dL (ref 12.0–15.0)
MCH: 30.6 pg (ref 26.0–34.0)
MCHC: 34.6 g/dL (ref 30.0–36.0)
MCV: 88.4 fL (ref 80.0–100.0)
Platelets: 241 10*3/uL (ref 150–400)
RBC: 4.48 MIL/uL (ref 3.87–5.11)
RDW: 12.1 % (ref 11.5–15.5)
WBC: 14 10*3/uL — ABNORMAL HIGH (ref 4.0–10.5)
nRBC: 0 % (ref 0.0–0.2)

## 2024-01-14 LAB — TROPONIN I (HIGH SENSITIVITY): Troponin I (High Sensitivity): 7 ng/L (ref <18)

## 2024-01-14 MED ORDER — MORPHINE SULFATE (PF) 4 MG/ML IV SOLN
4.0000 mg | Freq: Once | INTRAVENOUS | Status: AC
  Administered 2024-01-14: 4 mg via INTRAVENOUS
  Filled 2024-01-14: qty 1

## 2024-01-14 MED ORDER — ONDANSETRON HCL 4 MG/2ML IJ SOLN
4.0000 mg | Freq: Once | INTRAMUSCULAR | Status: AC
  Administered 2024-01-14: 4 mg via INTRAVENOUS
  Filled 2024-01-14: qty 2

## 2024-01-14 NOTE — ED Provider Notes (Signed)
 [facility] Provider Note    Event Date/Time   First MD Initiated Contact with Patient 01/14/24 1315     (approximate)   History   Weakness  Family is interpreting for the patient HPI  Jennifer Trujillo is a 63 y.o. female with a history of GERD, lupus who presents with complaints of anxiety, headache, all over body pain.  Family reports she has had these intermittently over the last several months and it is thought that this could be related to anxiety.  She denies neurodeficits.  No fevers or chills.     Physical Exam   Triage Vital Signs: ED Triage Vitals  Encounter Vitals Group     BP 01/14/24 1300 (!) 146/77     Girls Systolic BP Percentile --      Girls Diastolic BP Percentile --      Boys Systolic BP Percentile --      Boys Diastolic BP Percentile --      Pulse Rate 01/14/24 1300 75     Resp 01/14/24 1300 19     Temp 01/14/24 1300 97.8 F (36.6 C)     Temp Source 01/14/24 1300 Oral     SpO2 01/14/24 1300 100 %     Weight 01/14/24 1309 53.5 kg (118 lb)     Height 01/14/24 1309 1.524 m (5')     Head Circumference --      Peak Flow --      Pain Score 01/14/24 1309 10     Pain Loc --      Pain Education --      Exclude from Growth Chart --     Most recent vital signs: Vitals:   01/14/24 1300 01/14/24 1517  BP: (!) 146/77 123/61  Pulse: 75 73  Resp: 19 16  Temp: 97.8 F (36.6 C)   SpO2: 100% 100%     General: Awake, no distress.  CV:  Good peripheral perfusion.  Resp:  Normal effort.  Abd:  No distention.  Other:     ED Results / Procedures / Treatments   Labs (all labs ordered are listed, but only abnormal results are displayed) Labs Reviewed  BASIC METABOLIC PANEL WITH GFR - Abnormal; Notable for the following components:      Result Value   CO2 17 (*)    All other components within normal limits  CBC - Abnormal; Notable for the following components:   WBC 14.0 (*)    All other components within normal limits   TROPONIN I (HIGH SENSITIVITY)  TROPONIN I (HIGH SENSITIVITY)     EKG  ED ECG REPORT I, Lamar Price, the attending physician, personally viewed and interpreted this ECG.  Date: 01/14/2024  Rhythm: normal sinus rhythm QRS Axis: normal Intervals: normal ST/T Wave abnormalities: normal Narrative Interpretation: no evidence of acute ischemia    RADIOLOGY CT head viewed interpret by me, no acute abnormality    PROCEDURES:  Critical Care performed:   Procedures   MEDICATIONS ORDERED IN ED: Medications  morphine (PF) 4 MG/ML injection 4 mg (4 mg Intravenous Given 01/14/24 1352)  ondansetron  (ZOFRAN ) injection 4 mg (4 mg Intravenous Given 01/14/24 1352)     IMPRESSION / MDM / ASSESSMENT AND PLAN / ED COURSE  I reviewed the triage vital signs and the nursing notes. Patient's presentation is most consistent with severe exacerbation of chronic illness.  Patient presents with symptoms as above, differential includes lupus flare, anxiety, doubt ACS, given her headache which is new  also the possibility of ICH  CT head is reassuring, EKG, high sensitive troponin are normal.  Lab work is normal.  Patient treat with IV morphine and IV Zofran  with complete resolution of symptoms.  Discussed further observation in the emergency department which she feels better and would like to be discharged, I think this is reasonable, she will follow-up with her PCP        FINAL CLINICAL IMPRESSION(S) / ED DIAGNOSES   Final diagnoses:  Generalized weakness  Anxiety  Acute nonintractable headache, unspecified headache type     Rx / DC Orders   ED Discharge Orders     None        Note:  This document was prepared using Dragon voice recognition software and may include unintentional dictation errors.   Arlander Charleston, MD 01/14/24 [phone]

## 2024-01-14 NOTE — ED Triage Notes (Signed)
 Pt reports she had generalized pain since yesterday. Pt reports to day she woke up very weak this morning. Pt reports chest pressure,and headache more to he left side. Pt reports has been under al lot of pressure. Pt has a history of lupus and reports pain is similar to her lupus pain. Pt talks in complete sentences no respiratory distress noted at this time

## 2024-01-14 NOTE — ED Triage Notes (Signed)
 First nurse note: Pt to ED via [facility] from home. Pt has hx of lupus. Pt reports worsened back pain and head pain today.   97.6 CBG 104 139/70 100% RA HR 100

## 2024-01-15 ENCOUNTER — Ambulatory Visit: Payer: Self-pay | Admitting: Cardiology

## 2024-01-26 ENCOUNTER — Other Ambulatory Visit (HOSPITAL_COMMUNITY): Payer: Self-pay | Admitting: *Deleted

## 2024-01-26 MED ORDER — METOPROLOL TARTRATE 100 MG PO TABS: ORAL_TABLET | ORAL | 0 refills | Status: DC

## 2024-01-30 ENCOUNTER — Other Ambulatory Visit: Payer: Self-pay | Admitting: *Deleted

## 2024-01-30 ENCOUNTER — Telehealth: Payer: Self-pay | Admitting: Cardiology

## 2024-01-30 MED ORDER — METOPROLOL TARTRATE 100 MG PO TABS: ORAL_TABLET | ORAL | 0 refills | Status: AC

## 2024-01-30 NOTE — Progress Notes (Signed)
 Spoke with Camila (Pt's daughter) she states that they thought the procedure was on 7/10 and pt took her prescribed Metoprolol  100mg  the morning prior to going to [facility] but when they arrived to [facility] they told pt that her appointment was scheduled for 7/17.  Daughter states that pt did not have any side effects from taking medication; however, she asked if it was ok that she took the medication again on 7/17.  I explained that Metoprolol  Tartrate has a short life and it would be safe for pt to take prior to procedure.   Prescription was sent to [facility], [address] [place].  Pt's daughter appreciative on call back.

## 2024-01-30 NOTE — Telephone Encounter (Signed)
 Pt c/o medication issue:  1. Name of Medication:  metoprolol  tartrate (LOPRESSOR ) 100 MG tablet   2. How are you currently taking this medication (dosage and times per day)?   3. Are you having a reaction (difficulty breathing--STAT)?   4. What is your medication issue?   Daughter Allison) stated patient took this medication but her test was rescheduled and will need medication re-prescribed for the to take prior to her rescheduled CT test on 7/17

## 2024-02-01 ENCOUNTER — Telehealth (HOSPITAL_COMMUNITY): Payer: Self-pay | Admitting: *Deleted

## 2024-02-01 ENCOUNTER — Telehealth (HOSPITAL_COMMUNITY): Payer: Self-pay | Admitting: Emergency Medicine

## 2024-02-01 NOTE — Telephone Encounter (Signed)
 Patient's daughter returning call about the patient's upcoming cardiac imaging study; pt's daughter verbalizes understanding of appt date/time, parking situation and where to check in, pre-test NPO status and medications ordered, and verified current allergies; name and call back number provided for further questions should they arise  Chantal Requena RN Navigator Cardiac Imaging [facility] [phone] office [phone] cell  Patient to take 100mg  metoprolol  tartrate two hours prior to her cardiac CT scan.

## 2024-02-01 NOTE — Telephone Encounter (Signed)
 Attempted to call patient regarding upcoming cardiac CT appointment. Left message on voicemail with name and callback number Rockwell Alexandria RN Navigator Cardiac Imaging [facility] [phone] Office [phone] Cell

## 2024-02-02 ENCOUNTER — Ambulatory Visit
Admission: RE | Admit: 2024-02-02 | Discharge: 2024-02-02 | Disposition: A | Discharge status: Home / Self Care | Source: Ambulatory Visit | Attending: Cardiology | Admitting: Cardiology

## 2024-02-02 DIAGNOSIS — R072 Precordial pain: Secondary | ICD-10-CM | POA: Diagnosis present

## 2024-02-02 MED ORDER — IOHEXOL 350 MG/ML SOLN
100.0000 mL | Freq: Once | INTRAVENOUS | Status: AC | PRN
  Administered 2024-02-02: 100 mL via INTRAVENOUS

## 2024-02-02 MED ORDER — DILTIAZEM HCL 25 MG/5ML IV SOLN
10.0000 mg | INTRAVENOUS | Status: DC | PRN
  Filled 2024-02-02: qty 5

## 2024-02-02 MED ORDER — NITROGLYCERIN 0.4 MG SL SUBL
0.8000 mg | SUBLINGUAL_TABLET | Freq: Once | SUBLINGUAL | Status: AC
  Administered 2024-02-02: 0.8 mg via SUBLINGUAL
  Filled 2024-02-02: qty 25

## 2024-02-02 MED ORDER — METOPROLOL TARTRATE 5 MG/5ML IV SOLN
10.0000 mg | Freq: Once | INTRAVENOUS | Status: DC | PRN
  Filled 2024-02-02: qty 10

## 2024-02-02 MED ORDER — NITROGLYCERIN 0.4 MG SL SUBL: 0.8000 mg | SUBLINGUAL_TABLET | Freq: Once | SUBLINGUAL | Status: AC

## 2024-02-02 NOTE — Progress Notes (Signed)
 Patient tolerated CT well. Vital signs stable encourage to drink water throughout day.Reasons explained and verbalized understanding. Ambulated steady gait.

## 2024-04-09 ENCOUNTER — Ambulatory Visit: Attending: Cardiology | Admitting: Cardiology

## 2024-04-09 ENCOUNTER — Encounter: Payer: Self-pay | Admitting: Cardiology

## 2024-04-09 VITALS — BP 119/60 | HR 76 | Ht 60.0 in | Wt 119.4 lb

## 2024-04-09 DIAGNOSIS — R072 Precordial pain: Secondary | ICD-10-CM

## 2024-04-09 NOTE — Progress Notes (Signed)
 Cardiology Office Note:    Date:  04/09/2024   ID:  Jennifer Trujillo, DOB 12-07-1960, MRN [id number]  PCP:  Tobie Domino, MD   [facility] Providers Cardiologist:  None     Referring MD: Tobie Domino, MD   Chief Complaint  Patient presents with   Follow-up    Pt doing good.    History of Present Illness:    Jennifer Trujillo is a 63 y.o. female with a hx of GERD who presents for follow-up.  Previously seen due to symptoms of chest pain.  Symptoms of chest pain are not associated with exertion, sometimes laying on left side causes chest pressure,.  Symptoms occur sporadically.  Echocardiogram and coronary CT was obtained to evaluate cardiac etiology for chest pain.  Patient states feeling okay overall, no new concerns at this time.   Past Medical History:  Diagnosis Date   Asthma    GERD (gastroesophageal reflux disease)    Lupus     Past Surgical History:  Procedure Laterality Date   BACK SURGERY     middle of the back   ESOPHAGOGASTRODUODENOSCOPY (EGD) WITH PROPOFOL  N/A 05/28/2019   Procedure: ESOPHAGOGASTRODUODENOSCOPY (EGD) WITH PROPOFOL ;  Surgeon: Unk Corinn Skiff, MD;  Location: [facility] ENDOSCOPY;  Service: Gastroenterology;  Laterality: N/A;    Current Medications: Current Meds  Medication Sig   acetaminophen (TYLENOL) 500 MG tablet Take by mouth.   albuterol (VENTOLIN HFA) 108 (90 Base) MCG/ACT inhaler Inhale into the lungs.   aspirin 81 MG chewable tablet Chew by mouth.   Benralizumab (FASENRA PEN) 30 MG/ML SOAJ Inject into the skin.   calcium carbonate (OS-CAL) 600 MG TABS tablet Take by mouth.   celecoxib (CELEBREX) 100 MG capsule Take 100 mg by mouth daily.   cetirizine (ZYRTEC) 10 MG tablet Take by mouth.   Cholecalciferol (D2000 ULTRA STRENGTH) 50 MCG (2000 UT) CAPS Take by mouth.   cyclobenzaprine (FLEXERIL) 5 MG tablet Take 5 mg by mouth at bedtime as needed.   diclofenac Sodium (VOLTAREN) 1 % GEL SMARTSIG:2 Topical Twice Daily   DULoxetine  (CYMBALTA) 20 MG capsule Take 20 mg by mouth daily.   eletriptan (RELPAX) 40 MG tablet Take by mouth. (Patient taking differently: Take 40 mg by mouth as needed for migraine.)   EPIPEN 2-PAK 0.3 MG/0.3ML SOAJ injection Inject into the muscle once.   Ergocalciferol (VITAMIN D2) 50 MCG (2000 UT) TABS Take by mouth.   etodolac (LODINE) 500 MG tablet Take by mouth.   fluticasone (FLONASE) 50 MCG/ACT nasal spray Place into the nose.   gabapentin (NEURONTIN) 100 MG capsule Take by mouth.   guaiFENesin -codeine  100-10 MG/5ML syrup Take 5 mLs by mouth every 6 (six) hours as needed for cough.   hydroxychloroquine (PLAQUENIL) 200 MG tablet Take by mouth.   hydrOXYzine  (ATARAX /VISTARIL ) 25 MG tablet Take 1 tablet (25 mg total) by mouth 3 (three) times daily as needed for anxiety.   metoprolol  tartrate (LOPRESSOR ) 100 MG tablet TAKE 1 TABLET 2 HR PRIOR TO CARDIAC PROCEDURE   montelukast (SINGULAIR) 10 MG tablet Take by mouth.   omega-3 acid ethyl esters (LOVAZA) 1 g capsule Take by mouth.   omeprazole (PRILOSEC) 20 MG capsule Take 20 mg by mouth daily.   predniSONE (DELTASONE) 5 MG tablet Take by mouth.   SUMAtriptan (IMITREX) 50 MG tablet Take 50 mg by mouth as needed.   traMADol (ULTRAM) 50 MG tablet Take by mouth.   triamcinolone cream (KENALOG) 0.1 % Apply topically.     Allergies:  Daucus carota and Dextromethorphan-guaifenesin    Social History   Socioeconomic History   Marital status: Married    Spouse name: Not on file   Number of children: Not on file   Years of education: Not on file   Highest education level: Not on file  Occupational History   Not on file  Tobacco Use   Smoking status: Never   Smokeless tobacco: Never  Vaping Use   Vaping status: Never Used  Substance and Sexual Activity   Alcohol use: Never   Drug use: Never   Sexual activity: Not on file  Other Topics Concern   Not on file  Social History Narrative   Not on file   Social Drivers of Health   Financial  Resource Strain: Low Risk  (04/06/2024)   Received from [facility]   Overall Financial Resource Strain (CARDIA)    Difficulty of Paying Living Expenses: Not hard at all  Food Insecurity: No Food Insecurity (04/06/2024)   Received from [facility]   Hunger Vital Sign    Within the past 12 months, you worried that your food would run out before you got the money to buy more.: Never true    Within the past 12 months, the food you bought just didn't last and you didn't have money to get more.: Never true  Transportation Needs: No Transportation Needs (04/06/2024)   Received from [facility] - Transportation    In the past 12 months, has lack of transportation kept you from medical appointments or from getting medications?: No    Lack of Transportation (Non-Medical): No  Physical Activity: Not on file  Stress: Not on file  Social Connections: Unknown (09/02/2022)   Received from [facility]   Social Network    Social Network: Not on file     Family History: The patient's family history is negative for Breast cancer.  ROS:   Please see the history of present illness.     All other systems reviewed and are negative.  EKGs/Labs/Other Studies Reviewed:    The following studies were reviewed today:       Recent Labs: 01/14/2024: BUN 14; Creatinine, Ser 0.56; Hemoglobin 13.7; Platelets 241; Potassium 3.9; Sodium 137  Recent Lipid Panel No results found for: CHOL, TRIG, HDL, CHOLHDL, VLDL, LDLCALC, LDLDIRECT   Risk Assessment/Calculations:             Physical Exam:    VS:  BP 119/60 (BP Location: Left Arm, Patient Position: Sitting, Cuff Size: Normal)   Pulse 76   Ht 5' (1.524 m)   Wt 119 lb 6.4 oz (54.2 kg)   SpO2 100%   BMI 23.32 kg/m     Wt Readings from Last 3 Encounters:  04/09/24 119 lb 6.4 oz (54.2 kg)  01/14/24 118 lb (53.5 kg)  01/03/24 120 lb 8 oz (54.7 kg)     GEN:  Well  nourished, well developed in no acute distress HEENT: Normal NECK: No JVD; No carotid bruits CARDIAC: RRR, no murmurs, rubs, gallops RESPIRATORY:  Clear to auscultation without rales, wheezing or rhonchi  ABDOMEN: Soft, non-tender, non-distended MUSCULOSKELETAL:  No edema; No deformity  SKIN: Warm and dry NEUROLOGIC:  Alert and oriented x 3 PSYCHIATRIC:  Normal affect   ASSESSMENT:    1. Precordial pain     PLAN:    In order of problems listed above:  Chest pain, appears very atypical.  Currently resolved.  Coronary CTA  01/2024 calcium score 6.92, minimal LAD calcification/stenosis<25%.  Echo 12/2023 normal LV systolic function EF 55 to 60%. patient made aware of results, reassured.   Follow-up as needed.     Medication Adjustments/Labs and Tests Ordered: Current medicines are reviewed at length with the patient today.  Concerns regarding medicines are outlined above.  No orders of the defined types were placed in this encounter.  No orders of the defined types were placed in this encounter.   Patient Instructions  Medication Instructions:  Your physician recommends that you continue on your current medications as directed. Please refer to the Current Medication list given to you today.   *If you need a refill on your cardiac medications before your next appointment, please call your pharmacy*  Lab Work: No labs ordered today  If you have labs (blood work) drawn today and your tests are completely normal, you will receive your results only by: MyChart Message (if you have MyChart) OR A paper copy in the mail If you have any lab test that is abnormal or we need to change your treatment, we will call you to review the results.  Testing/Procedures: No test ordered today   Follow-Up: At [facility], you and your health needs are our priority.  As part of our continuing mission to provide you with exceptional heart care, our providers are all part of one team.  This  team includes your primary Cardiologist (physician) and Advanced Practice Providers or APPs (Physician Assistants and Nurse Practitioners) who all work together to provide you with the care you need, when you need it.  Your next appointment:   As needed             Signed, Redell Cave, MD  04/09/2024 3:02 PM    [facility]

## 2024-04-09 NOTE — Patient Instructions (Signed)

## 2024-04-27 ENCOUNTER — Other Ambulatory Visit: Payer: Self-pay | Admitting: Family Medicine

## 2024-04-27 DIAGNOSIS — Z1231 Encounter for screening mammogram for malignant neoplasm of breast: Secondary | ICD-10-CM

## 2024-06-13 ENCOUNTER — Encounter

## 2024-07-22 ENCOUNTER — Encounter: Payer: Self-pay | Admitting: Emergency Medicine

## 2024-07-22 ENCOUNTER — Ambulatory Visit
Admission: EM | Admit: 2024-07-22 | Discharge: 2024-07-22 | Disposition: A | Discharge status: Home / Self Care | Attending: Emergency Medicine | Admitting: Emergency Medicine

## 2024-07-22 ENCOUNTER — Telehealth: Payer: Self-pay | Admitting: Emergency Medicine

## 2024-07-22 DIAGNOSIS — R55 Syncope and collapse: Secondary | ICD-10-CM | POA: Diagnosis not present

## 2024-07-22 DIAGNOSIS — J101 Influenza due to other identified influenza virus with other respiratory manifestations: Secondary | ICD-10-CM

## 2024-07-22 LAB — POCT INFLUENZA A/B
Influenza A, POC: POSITIVE — AB
Influenza B, POC: NEGATIVE

## 2024-07-22 LAB — POC SOFIA SARS ANTIGEN FIA: SARS Coronavirus 2 Ag: NEGATIVE

## 2024-07-22 LAB — POCT RAPID STREP A (OFFICE): Rapid Strep A Screen: NEGATIVE

## 2024-07-22 MED ORDER — HYDROCOD POLI-CHLORPHE POLI ER 10-8 MG/5ML PO SUER: 5.0000 mL | Freq: Two times a day (BID) | ORAL | 0 refills | Status: AC | PRN

## 2024-07-22 MED ORDER — ONDANSETRON 4 MG PO TBDP: 4.0000 mg | ORAL_TABLET | Freq: Three times a day (TID) | ORAL | 0 refills | Status: DC | PRN

## 2024-07-22 MED ORDER — IBUPROFEN 600 MG PO TABS: 600.0000 mg | ORAL_TABLET | Freq: Four times a day (QID) | ORAL | 0 refills | Status: AC | PRN

## 2024-07-22 MED ORDER — ONDANSETRON 4 MG PO TBDP: 4.0000 mg | ORAL_TABLET | Freq: Three times a day (TID) | ORAL | 0 refills | Status: AC | PRN

## 2024-07-22 MED ORDER — OSELTAMIVIR PHOSPHATE 75 MG PO CAPS: 75.0000 mg | ORAL_CAPSULE | Freq: Two times a day (BID) | ORAL | 0 refills | Status: AC

## 2024-07-22 NOTE — Addendum Note (Signed)
 Addended by: LYTLE LAYMON HERO on: 07/22/2024 03:37 PM   Modules accepted: Orders

## 2024-07-22 NOTE — Telephone Encounter (Signed)
 Patient's daughter called, patient is having nausea and vomiting.  We can try Zofran  4 mg ODT 3 times daily.  e prescribed this to pharmacy on [facility].  If this does not help, or if patient gets worse, she is to go to the ED.  Staff informed family member.  She agrees with plan.

## 2024-07-22 NOTE — Telephone Encounter (Signed)
 Pt wanted Rx sent to another pharmacy. Sent rx to [facility] [address].

## 2024-07-22 NOTE — ED Triage Notes (Signed)
 Patient c/o bodyaches, chest congestion, sore throat, and fever that started last night.  Patient states that she did fall in her bathroom this morning.

## 2024-07-22 NOTE — ED Provider Notes (Signed)
 " HPI  SUBJECTIVE:  Jennifer Trujillo is a 64 y.o. female who presents with feeling feverish with bodyaches, headaches, generalized weakness, nasal congestion, sore throat, rhinorrhea, cough productive of phlegm starting last night.  She denies postnasal drip, chest pain, wheezing, shortness of breath, nausea, vomiting, diarrhea, abdominal pain.  She was exposed to influenza from her grandson.  She got this years flu vaccine.  She took Tylenol with some improvement in her symptoms.  No aggravating factors.  She also reports having a syncopal episode after using the bathroom this morning.  She states that she stood up rapidly, became dizzy with feeling flushed, and then states that she woke up on the floor.  She states that she does not remember falling.  Her husband states that he found her fully alert immediately after, but pale and clammy.  She denies preceding chest pain, shortness of breath, palpitations prior to syncopized in.  She denies hitting her head, new or different headache, neck pain.  She has a past medical history of asthma, GERD and lupus.  She has had a syncopal episode before, and was evaluated by EMS, but was never formally evaluated for this.  No history of chronic kidney disease.  [facility]: [facility].  All history obtained through video interpreter [place]  Past Medical History:  Diagnosis Date   Asthma    GERD (gastroesophageal reflux disease)    Lupus     Past Surgical History:  Procedure Laterality Date   BACK SURGERY     middle of the back   ESOPHAGOGASTRODUODENOSCOPY (EGD) WITH PROPOFOL  N/A 05/28/2019   Procedure: ESOPHAGOGASTRODUODENOSCOPY (EGD) WITH PROPOFOL ;  Surgeon: Unk Corinn Skiff, MD;  Location: [facility] ENDOSCOPY;  Service: Gastroenterology;  Laterality: N/A;    Family History  Problem Relation Age of Onset   Breast cancer Neg Hx     Social History[1]  Current Medications[2]  Allergies[3]   ROS  As noted in HPI.   Physical  Exam  BP 111/67 (BP Location: Right Arm)   Pulse 97   Temp 99.1 F (37.3 C) (Oral)   Resp 14   Ht 5' (1.524 m)   Wt 54.2 kg   SpO2 97%   BMI 23.34 kg/m   Constitutional: Well developed, well nourished, no acute distress Eyes: PERRL, EOMI, conjunctiva normal bilaterally HENT: Normocephalic, atraumatic,mucus membranes moist.  Positive nasal congestion. Respiratory: Clear to auscultation bilaterally, no rales, no wheezing, no rhonchi Cardiovascular: Normal rate and rhythm, no murmurs, no gallops, no rubs GI: Nondistended skin: No rash, skin intact Musculoskeletal:no deformities Neurologic: Alert & oriented x 3, CN III-XII grossly intact, no motor deficits, sensation grossly intact Psychiatric: Speech and behavior appropriate   ED Course   Medications - No data to display  Orders Placed This Encounter  Procedures   POC rapid strep A    Standing Status:   Standing    Number of Occurrences:   1   POC Influenza A/B    Standing Status:   Standing    Number of Occurrences:   1   POC SARS Coronavirus Ag    Standing Status:   Standing    Number of Occurrences:   1   ED EKG    Standing Status:   Standing    Number of Occurrences:   1    Reason for Exam:   Syncope   EKG 12-Lead    Standing Status:   Standing    Number of Occurrences:   1   Results for orders  placed or performed during the hospital encounter of 07/22/24 (from the past 24 hours)  POC rapid strep A     Status: Normal   Collection Time: 07/22/24  8:42 AM  Result Value Ref Range   Rapid Strep A Screen Negative Negative  POC Influenza A/B     Status: Abnormal   Collection Time: 07/22/24  8:42 AM  Result Value Ref Range   Influenza A, POC Positive (A) Negative   Influenza B, POC Negative Negative  POC SARS Coronavirus Ag     Status: Normal   Collection Time: 07/22/24  8:42 AM  Result Value Ref Range   SARS Coronavirus 2 Ag Negative Negative   No results found.  ED Clinical Impression  1. Influenza A    2. Vasovagal syncope      ED Assessment/Plan    EKG: Normal sinus rhythm, rate 87.  Normal axis, normal normals.  No hypertrophy.  No ST-T wave changes consistent with ischemia.  No change compared to EKG from 01/16/2024.   GFR from labs done in July 2025 above 60.  Influenza A positive.  Strep, COVID-negative.  Discussed with patient and spouse while in department.  Patient has influenza A and I suspect she had an episode of vasovagal syncope today.  She is not on any blood thinners, denies change in her headache, or neck pain.  She does not remember hitting her head but has no anterograde or retrograde amnesia.  Her EKG is unremarkable.  She believes that she had a syncopal episode because she is ill.  Low suspicion for cardiac cause, arrhythmia, PE.  Will send home with Tamiflu , push electrolyte containing fluids, Tylenol, with ibuprofen , Tussionex.  She is allergic to Promethazine DM.  She is to start Flonase and saline nasal irrigation and use her albuterol inhaler with spacer every 4-6 hours as needed for coughing, wheezing, shortness of breath given her history of asthma.  She does not need a prescription for albuterol, spacer or Flonase.  She is to go to the ED if she gets worse, has difficulty breathing or if she passes out again.  Using the video interpreter, discussed labs, EKG, MDM, treatment plan, and plan for follow-up with patient and spouse.  Answered all questions.  Discussed sn/sx that should prompt return to the ED. they agree with plan.   Patient's family member contacted clinic later on in the day, stating the patient was nauseous with vomiting.  He prescribed in prescription of Zofran  4 mg ODT, however, staff notified family member that if the patient gets worse, is unable to tolerate p.o., or has another syncopal episode, she is to go immediately to the ER.  Family member agrees with plan.   Spent 33 minutes in the care of this patient.  Meds ordered this encounter   Medications   oseltamivir  (TAMIFLU ) 75 MG capsule    Sig: Take 1 capsule (75 mg total) by mouth 2 (two) times daily. X 5 days    Dispense:  10 capsule    Refill:  0   ibuprofen  (ADVIL ) 600 MG tablet    Sig: Take 1 tablet (600 mg total) by mouth every 6 (six) hours as needed.    Dispense:  30 tablet    Refill:  0   chlorpheniramine-HYDROcodone (TUSSIONEX) 10-8 MG/5ML    Sig: Take 5 mLs by mouth every 12 (twelve) hours as needed for cough.    Dispense:  60 mL    Refill:  0      *  This clinic note was created using Scientist, clinical (histocompatibility and immunogenetics). Therefore, there may be occasional mistakes despite careful proofreading. ?      [1]  Social History Tobacco Use   Smoking status: Never   Smokeless tobacco: Never  Vaping Use   Vaping status: Never Used  Substance Use Topics   Alcohol use: Never   Drug use: Never  [2] No current facility-administered medications for this encounter.  Current Outpatient Medications:    chlorpheniramine-HYDROcodone (TUSSIONEX) 10-8 MG/5ML, Take 5 mLs by mouth every 12 (twelve) hours as needed for cough., Disp: 60 mL, Rfl: 0   ibuprofen  (ADVIL ) 600 MG tablet, Take 1 tablet (600 mg total) by mouth every 6 (six) hours as needed., Disp: 30 tablet, Rfl: 0   oseltamivir  (TAMIFLU ) 75 MG capsule, Take 1 capsule (75 mg total) by mouth 2 (two) times daily. X 5 days, Disp: 10 capsule, Rfl: 0   acetaminophen (TYLENOL) 500 MG tablet, Take by mouth., Disp: , Rfl:    albuterol (VENTOLIN HFA) 108 (90 Base) MCG/ACT inhaler, Inhale into the lungs., Disp: , Rfl:    aspirin 81 MG chewable tablet, Chew by mouth., Disp: , Rfl:    Benralizumab (FASENRA PEN) 30 MG/ML SOAJ, Inject into the skin., Disp: , Rfl:    Calcium Carb-Cholecalciferol 600-10 MG-MCG TABS, Take 1 tablet by mouth 2 (two) times daily., Disp: , Rfl:    calcium carbonate (OS-CAL) 600 MG TABS tablet, Take by mouth., Disp: , Rfl:    [Paused] celecoxib (CELEBREX) 100 MG capsule, Take 100 mg by mouth daily., Disp: ,  Rfl:    [Paused] cetirizine (ZYRTEC) 10 MG tablet, Take by mouth., Disp: , Rfl:    Cholecalciferol (D2000 ULTRA STRENGTH) 50 MCG (2000 UT) CAPS, Take by mouth., Disp: , Rfl:    [Paused] cyclobenzaprine (FLEXERIL) 5 MG tablet, Take 5 mg by mouth at bedtime as needed., Disp: , Rfl:    diclofenac Sodium (VOLTAREN) 1 % GEL, SMARTSIG:2 Topical Twice Daily, Disp: , Rfl:    DULoxetine (CYMBALTA) 20 MG capsule, Take 20 mg by mouth daily., Disp: , Rfl:    eletriptan (RELPAX) 40 MG tablet, Take by mouth. (Patient taking differently: Take 40 mg by mouth as needed for migraine.), Disp: , Rfl:    EPIPEN 2-PAK 0.3 MG/0.3ML SOAJ injection, Inject into the muscle once., Disp: , Rfl:    Ergocalciferol (VITAMIN D2) 50 MCG (2000 UT) TABS, Take by mouth., Disp: , Rfl:    etodolac (LODINE) 500 MG tablet, Take by mouth., Disp: , Rfl:    fluticasone (FLONASE) 50 MCG/ACT nasal spray, Place into the nose., Disp: , Rfl:    [Paused] gabapentin (NEURONTIN) 100 MG capsule, Take by mouth., Disp: , Rfl:    hydroxychloroquine (PLAQUENIL) 200 MG tablet, Take by mouth., Disp: , Rfl:    hydrOXYzine  (ATARAX /VISTARIL ) 25 MG tablet, Take 1 tablet (25 mg total) by mouth 3 (three) times daily as needed for anxiety., Disp: 20 tablet, Rfl: 0   metoprolol  tartrate (LOPRESSOR ) 100 MG tablet, TAKE 1 TABLET 2 HR PRIOR TO CARDIAC PROCEDURE, Disp: 1 tablet, Rfl: 0   montelukast (SINGULAIR) 10 MG tablet, Take by mouth., Disp: , Rfl:    omega-3 acid ethyl esters (LOVAZA) 1 g capsule, Take by mouth., Disp: , Rfl:    omeprazole (PRILOSEC) 20 MG capsule, Take 20 mg by mouth daily., Disp: , Rfl:    predniSONE (DELTASONE) 5 MG tablet, Take by mouth., Disp: , Rfl:    riboflavin (VITAMIN B-2) 100 MG TABS tablet, Take by  mouth. (Patient not taking: Reported on 04/09/2024), Disp: , Rfl:    SUMAtriptan (IMITREX) 50 MG tablet, Take 50 mg by mouth as needed., Disp: , Rfl:    [Paused] traMADol (ULTRAM) 50 MG tablet, Take by mouth., Disp: , Rfl:     triamcinolone cream (KENALOG) 0.1 %, Apply topically., Disp: , Rfl:  [3]  Allergies Allergen Reactions   Daucus [place] Other (See Comments) and Shortness Of Breath    Asthma attack Asthma attack Asthma attack    Dextromethorphan-Guaifenesin  Other (See Comments)     Van Knee, MD 07/23/24 [phone]  "

## 2024-07-22 NOTE — Discharge Instructions (Addendum)
 Tamiflu , push electrolyte containing fluids such as Pedialyte, liquid IV, 1000 mg of Tylenol combined with 400 to 600 mg of ibuprofen  3-4 times a day as needed for fevers, body aches Tussionex for cough  start Flonase and saline nasal irrigation with a NeilMed sinus rinse and distilled water as often as you want and use your albuterol inhaler with spacer every 4-6 hours as needed for coughing, wheezing, shortness of breath.   go to the ED if you get worse, have shortness of breath, or if you pass out again, or for any concerns.  Do not take Celebrex if taking ibuprofen .  Do not take Flexeril, tramadol, Neurontin, diazepam if taking Tussionex.  Tamiflu , administre lquidos con electrolitos como Pedialyte o suero oral, 1000 mg de Tylenol combinados con 400 a 600 mg de ibuprofeno 3-4 veces al da segn sea necesario para la fiebre y los dolores corporales, Tussionex para la tos. Comience con Flonase e irrigacin nasal con solucin salina utilizando un irrigador nasal NeilMed y agua destilada con la frecuencia que desee, y use su inhalador de albuterol con espaciador cada 4-6 horas segn sea necesario para la tos, las sibilancias o la dificultad para industrial/product designer. Acuda a urgencias si empeora, tiene dificultad para respirar, vuelve a desmayarse o si tiene alguna otra preocupacin. No tome Celebrex si est tomando ibuprofeno. No tome Flexeril, tramadol, Neurontin ni diazepam si est tomando Tussionex.

## 2024-07-25 ENCOUNTER — Ambulatory Visit
Admission: RE | Admit: 2024-07-25 | Discharge: 2024-07-25 | Disposition: A | Discharge status: Home / Self Care | Source: Ambulatory Visit | Attending: Family Medicine | Admitting: Family Medicine

## 2024-07-25 DIAGNOSIS — Z1231 Encounter for screening mammogram for malignant neoplasm of breast: Secondary | ICD-10-CM | POA: Diagnosis present
